# Patient Record
Sex: Female | Born: 1995 | Hispanic: Yes | Smoking: Never smoker
Health system: Southern US, Community
[De-identification: ages and names within clinical notes are randomized; demographics above are authoritative.]

## PROBLEM LIST (undated history)

## (undated) ENCOUNTER — Inpatient Hospital Stay: Payer: Self-pay

## (undated) DIAGNOSIS — G4730 Sleep apnea, unspecified: Secondary | ICD-10-CM

## (undated) DIAGNOSIS — K219 Gastro-esophageal reflux disease without esophagitis: Secondary | ICD-10-CM

## (undated) DIAGNOSIS — K828 Other specified diseases of gallbladder: Secondary | ICD-10-CM

## (undated) DIAGNOSIS — E079 Disorder of thyroid, unspecified: Secondary | ICD-10-CM

## (undated) DIAGNOSIS — F419 Anxiety disorder, unspecified: Secondary | ICD-10-CM

## (undated) DIAGNOSIS — D649 Anemia, unspecified: Secondary | ICD-10-CM

## (undated) DIAGNOSIS — F32A Depression, unspecified: Secondary | ICD-10-CM

## (undated) HISTORY — PX: NO PAST SURGERIES: SHX2092

---
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
## 2016-10-02 ENCOUNTER — Emergency Department: Payer: Self-pay

## 2016-10-02 ENCOUNTER — Emergency Department
Admission: EM | Admit: 2016-10-02 | Discharge: 2016-10-02 | Disposition: A | Discharge status: Home / Self Care | Payer: Self-pay | Attending: Emergency Medicine | Admitting: Emergency Medicine

## 2016-10-02 DIAGNOSIS — S9031XA Contusion of right foot, initial encounter: Secondary | ICD-10-CM | POA: Insufficient documentation

## 2016-10-02 DIAGNOSIS — W208XXA Other cause of strike by thrown, projected or falling object, initial encounter: Secondary | ICD-10-CM | POA: Insufficient documentation

## 2016-10-02 DIAGNOSIS — Y9389 Activity, other specified: Secondary | ICD-10-CM | POA: Insufficient documentation

## 2016-10-02 DIAGNOSIS — Y990 Civilian activity done for income or pay: Secondary | ICD-10-CM | POA: Insufficient documentation

## 2016-10-02 DIAGNOSIS — Y929 Unspecified place or not applicable: Secondary | ICD-10-CM | POA: Insufficient documentation

## 2016-10-02 MED ORDER — MELOXICAM 7.5 MG PO TABS: 7.5000 mg | ORAL_TABLET | Freq: Every day | ORAL | 1 refills | Status: DC

## 2016-10-02 NOTE — ED Provider Notes (Signed)
[facility] Emergency Department Provider Note  ____________________________________________  Time seen: Approximately 6:40 PM  I have reviewed the triage vital signs and the nursing notes.   HISTORY  Chief Complaint Foot Pain    HPI Tamara Harrison is a 21 y.o. female presenting to the emergency department with 4 out of 10 aching right foot pain. Patient states that a piece of heavy metal was dropped on her foot at work. No falls were associated with foot contusion. Patient works in [employer]. Patient states that she has been able to ambulate without difficulty. She denies prior traumas or surgeries to the right lower extremity. Patient states that she has tried ibuprofen, which has minimally relieved her symptoms.   No past medical history on file.  There are no active problems to display for this patient.   History reviewed. No pertinent surgical history.  Prior to Admission medications   Medication Sig Start Date End Date Taking? Authorizing Provider  meloxicam (MOBIC) 7.5 MG tablet Take 1 tablet (7.5 mg total) by mouth daily. 10/02/16 10/02/17  Lannie Fields, PA-C    Allergies Patient has no known allergies.  No family history on file.  Social History Social History  Substance Use Topics  . Smoking status: Never Smoker  . Smokeless tobacco: Never Used  . Alcohol use No     Review of Systems  Constitutional: No fever/chillsts. Cardiovascular: no chest pain. Respiratory: no cough. No SOB. Musculoskeletal: Patient has right foot pain.  Skin: Patient has mild bruising of skin overlying first digit of right lower extremity.  Neurological: Negative for headaches, focal weakness or numbness. 10-point ROS otherwise negative.  ____________________________________________   PHYSICAL EXAM:  VITAL SIGNS: ED Triage Vitals  Enc Vitals Group     BP 10/02/16 1625 (!) 168/78     Pulse Rate 10/02/16 1625 64     Resp 10/02/16 1625 18     Temp  10/02/16 1625 97.6 F (36.4 C)     Temp Source 10/02/16 1625 Oral     SpO2 10/02/16 1625 100 %     Weight 10/02/16 1626 230 lb (104.3 kg)     Height 10/02/16 1626 5\' 5"  (1.651 m)     Head Circumference --      Peak Flow --      Pain Score 10/02/16 1626 5     Pain Loc --      Pain Edu? --      Excl. in [place]? --     Constitutional: Alert and oriented. Well appearing and in no acute distress. Cardiovascular: Normal rate, regular rhythm. Normal S1 and S2. Good peripheral circulation. Respiratory: Normal respiratory effort without tachypnea or retractions. Lungs CTAB. Good air entry to the bases with no decreased or absent breath sounds. Musculoskeletal: Patient has 5 out of 5 strength in the lower extremities bilaterally. Patient is able to perform full dorsiflexion, plantarflexion, inversion and eversion at the right ankle. Patient is able to move all 5 toes. Patient is able to perform resisted extension at the first digit of the right lower extremity. Patient has no significant tenderness elicited with palpation of the metatarsals or digits, right. Palpable dorsalis pedis pulse, right Neurologic:  Normal speech and language. No gross focal neurologic deficits are appreciated. Reflexes are 2+ and symmetric in the lower extremities bilaterally. Skin: Patient has mild bruising of the skin overlying the first digit of the right lower extremity. Psychiatric: Mood and affect are normal. Speech and behavior are normal. Patient exhibits  appropriate insight and judgement. ____________________________________________   LABS (all labs ordered are listed, but only abnormal results are displayed)  Labs Reviewed - No data to display ____________________________________________  EKG   ____________________________________________  RADIOLOGY Unk Pinto, personally viewed and evaluated these images (plain radiographs) as part of my medical decision making, as well as reviewing the written report  by the radiologist.  Dg Foot Complete Right  Result Date: 10/02/2016 CLINICAL DATA:  Status post trauma to the foot on September 27, 2016 with bruising of the first toe. EXAM: RIGHT FOOT COMPLETE - 3+ VIEW COMPARISON:  None. FINDINGS: There is no evidence of fracture or dislocation. There is no evidence of arthropathy or other focal bone abnormality. Soft tissues are unremarkable. IMPRESSION: Negative. Electronically Signed   By: Abelardo Diesel M.D.   On: 10/02/2016 18:28    ____________________________________________    PROCEDURES  Procedure(s) performed:    Procedures    Medications - No data to display   ____________________________________________   INITIAL IMPRESSION / ASSESSMENT AND PLAN / ED COURSE  Pertinent labs & imaging results that were available during my care of the patient were reviewed by me and considered in my medical decision making (see chart for details).  Review of the [place] CSRS was performed in accordance of the [place] prior to dispensing any controlled drugs.  Clinical Course    Assessment and plan: Right Foot Contusion  Patient presents with right foot pain after a piece of metal fell on her foot at work. DG right foot reveals no fractures, dislocations or bony abnormalities.On physical exam, patient is able to perform full range of motion at the right ankle. She has no significant tenderness elicited with palpation of the metatarsals or digits, right. Foot contusion is likely. Patient was discharged with Mobic. A referral was made to orthopedics, Dr. Page Spiro. Patient was advised to make an appointment in one week if right foot pain persists. All patient questions were answered.    ____________________________________________  FINAL CLINICAL IMPRESSION(S) / ED DIAGNOSES  Final diagnoses:  Contusion of right foot, initial encounter      NEW MEDICATIONS STARTED DURING THIS VISIT:  Discharge Medication List as of 10/02/2016  6:45 PM    START taking  these medications   Details  meloxicam (MOBIC) 7.5 MG tablet Take 1 tablet (7.5 mg total) by mouth daily., Starting Mon 10/02/2016, Until Tue 10/02/2017, Print            This chart was dictated using voice recognition software/Dragon. Despite best efforts to proofread, errors can occur which can change the meaning. Any change was purely unintentional.    Lannie Fields, PA-C 10/03/16 1126    Schuyler Amor, MD 10/05/16 2233

## 2016-10-02 NOTE — ED Triage Notes (Signed)
Pt c/o right foot pain and swelling when standing on it X 1 week, no known injury. Pain relieved with rest. Pt alert and oriented X4, active, cooperative, pt in NAD. RR even and unlabored, color WNL.

## 2016-10-02 NOTE — L&D Delivery Note (Addendum)
Delivery Note At 3:24 AM a viable and healthy baby girl "Brisa" was delivered via Vaginal, Spontaneous Delivery (Presentation: LOA ;  ).  APGAR:7 ,9 ; weight 3440g  .   Placenta status: expressed, .  Cord: 3vc with the following complications: true knot, nuchal x1.  Anesthesia:  epidural Episiotomy:  none Lacerations: Periurethral, bilateral Suture Repair: 2.0 vicryl Est. Blood Loss (mL):  1500  Mom to postpartum.  Baby to Couplet care / Skin to Skin.   complications:  - intrapartum hemorrhage - true knot in cord - nuchal cord x1 - BMI 44 - intrapartum stroke signs with neuro consult: CT of head neg, likely Bells palsy - hypothyroid - elevated BP without dx of gHTN - UDS positive for cocaine during pregnancy, negative on hospital admission - trichomoniasis in pregnancy  21yo G1P0 at 39+2wks, iol for obesity - prenatal care at [facility]. During induction, right sided facial drooping noticed. Stat neuro consult, and non-contrast head CT neg; dx of likely Bell's palsy made.   Pitocin given. Pt received epidural and pushed over intact perineum. Baby delivered easily, with a loose nuchal reduced on perineum and baby passed to maternal abdomen. Initial stimulation required for vigorous crying, and baby was taken to the warmer. Prior to delivery of the placenta, brisk bright red bleeding noted from right labial tear and from uterus. The placenta was expressed and manual evacuation of small retained membrane, along with 223mcg of hemabate given IM, slowed bleeding.   Sutures placed interrupted on right labia. Fundus firm, bleeding minimal at close of case. Baby on maternal skin to skin, vigorous and crying. Cord gases collected but not sent.  Sponge and sharp count correct. Pt tolerated procedure well.  Quant blood loss 1515cc  Benjaman Kindler 07/03/2017, 3:50 AM

## 2017-01-18 LAB — OB RESULTS CONSOLE RPR: RPR: NONREACTIVE

## 2017-01-18 LAB — OB RESULTS CONSOLE HIV ANTIBODY (ROUTINE TESTING): HIV: NONREACTIVE

## 2017-01-18 LAB — OB RESULTS CONSOLE GC/CHLAMYDIA
Chlamydia: NEGATIVE
Gonorrhea: NEGATIVE

## 2017-01-18 LAB — OB RESULTS CONSOLE VARICELLA ZOSTER ANTIBODY, IGG: Varicella: NON-IMMUNE/NOT IMMUNE

## 2017-01-18 LAB — OB RESULTS CONSOLE HEPATITIS B SURFACE ANTIGEN: Hepatitis B Surface Ag: NEGATIVE

## 2017-01-18 LAB — OB RESULTS CONSOLE RUBELLA ANTIBODY, IGM: RUBELLA: IMMUNE

## 2017-03-21 ENCOUNTER — Encounter: Payer: Self-pay | Admitting: Emergency Medicine

## 2017-03-21 ENCOUNTER — Emergency Department
Admission: EM | Admit: 2017-03-21 | Discharge: 2017-03-21 | Disposition: A | Discharge status: Home / Self Care | Payer: Medicaid Other | Attending: Emergency Medicine | Admitting: Emergency Medicine

## 2017-03-21 DIAGNOSIS — Z3A21 21 weeks gestation of pregnancy: Secondary | ICD-10-CM | POA: Insufficient documentation

## 2017-03-21 DIAGNOSIS — O99712 Diseases of the skin and subcutaneous tissue complicating pregnancy, second trimester: Secondary | ICD-10-CM | POA: Diagnosis not present

## 2017-03-21 DIAGNOSIS — Z79899 Other long term (current) drug therapy: Secondary | ICD-10-CM | POA: Diagnosis not present

## 2017-03-21 DIAGNOSIS — L209 Atopic dermatitis, unspecified: Secondary | ICD-10-CM | POA: Insufficient documentation

## 2017-03-21 MED ORDER — HYDROCORTISONE 1 % EX CREA
TOPICAL_CREAM | Freq: Two times a day (BID) | CUTANEOUS | Status: DC
  Administered 2017-03-21: 06:00:00 via TOPICAL
  Filled 2017-03-21: qty 28

## 2017-03-21 MED ORDER — DIPHENHYDRAMINE HCL 25 MG PO CAPS
50.0000 mg | ORAL_CAPSULE | Freq: Once | ORAL | Status: AC
  Administered 2017-03-21: 50 mg via ORAL
  Filled 2017-03-21: qty 2

## 2017-03-21 NOTE — ED Provider Notes (Signed)
[facility] Emergency Department Provider Note    First MD Initiated Contact with Patient 03/21/17 [phone]     (approximate)  I have reviewed the triage vital signs and the nursing notes.   HISTORY  Chief Complaint Rash    HPI Tamara Harrison is a 21 y.o. female 5 months pregnant presents to the emergency department with generalized pruritic rash bilateral arms chest and back. Patient denies any fever no nausea vomiting no headache.   Past medical history Currently 5 months pregnant There are no active problems to display for this patient.   Past surgical history None  Prior to Admission medications   Medication Sig Start Date End Date Taking? Authorizing Provider  meloxicam (MOBIC) 7.5 MG tablet Take 1 tablet (7.5 mg total) by mouth daily. 10/02/16 10/02/17  Lannie Fields, PA-C    Allergies No known drug allergies  No family history on file.  Social History Social History  Substance Use Topics  . Smoking status: Never Smoker  . Smokeless tobacco: Never Used  . Alcohol use No    Review of Systems Constitutional: No fever/chills Eyes: No visual changes. ENT: No sore throat. Cardiovascular: Denies chest pain. Respiratory: Denies shortness of breath. Gastrointestinal: No abdominal pain.  No nausea, no vomiting.  No diarrhea.  No constipation. Genitourinary: Negative for dysuria. Musculoskeletal: Negative for neck pain.  Negative for back pain. Integumentary: Positive for rash. Neurological: Negative for headaches, focal weakness or numbness.   ____________________________________________   PHYSICAL EXAM:  VITAL SIGNS: ED Triage Vitals  Enc Vitals Group     BP 03/21/17 0121 (!) 153/65     Pulse Rate 03/21/17 0121 89     Resp 03/21/17 0121 20     Temp 03/21/17 0121 98.4 F (36.9 C)     Temp Source 03/21/17 0121 Oral     SpO2 03/21/17 0121 99 %     Weight 03/21/17 0120 118.8 kg (262 lb)     Height 03/21/17 0120 1.676 m (5\' 6" )       Head Circumference --      Peak Flow --      Pain Score --      Pain Loc --      Pain Edu? --      Excl. in [place]? --     Constitutional: Alert and oriented. Well appearing and in no acute distress. Eyes: Conjunctivae are normal. Head: Atraumatic. Mouth/Throat: Mucous membranes are moist. Neck: No stridor.   Cardiovascular: Normal rate, regular rhythm. Good peripheral circulation. Grossly normal heart sounds. Respiratory: Normal respiratory effort.  No retractions. Lungs CTAB. Gastrointestinal: Soft and nontender. No distention.  Musculoskeletal: No lower extremity tenderness nor edema. No gross deformities of extremities. Neurologic:  Normal speech and language. No gross focal neurologic deficits are appreciated.  Skin:  Petechial rash noted bilateral upper extremity including the antecubital fossa chest and upper back with evidence of excoriation. Psychiatric: Mood and affect are normal. Speech and behavior are normal.   Procedures   ____________________________________________   INITIAL IMPRESSION / ASSESSMENT AND PLAN / ED COURSE  Pertinent labs & imaging results that were available during my care of the patient were reviewed by me and considered in my medical decision making (see chart for details).  Patient given topical hydrocortisone and Benadryl in the emergency department with suspicion of atopic dermatitis pregnancy.      ____________________________________________  FINAL CLINICAL IMPRESSION(S) / ED DIAGNOSES  Final diagnoses:  Atopic dermatitis, unspecified type  MEDICATIONS GIVEN DURING THIS VISIT:  Medications  hydrocortisone cream 1 % (not administered)  diphenhydrAMINE (BENADRYL) capsule 50 mg (not administered)     NEW OUTPATIENT MEDICATIONS STARTED DURING THIS VISIT:  New Prescriptions   No medications on file    Modified Medications   No medications on file    Discontinued Medications   No medications on file     Note:   This document was prepared using Dragon voice recognition software and may include unintentional dictation errors.    Gregor Hams, MD 03/22/17 [phone]

## 2017-03-21 NOTE — ED Triage Notes (Addendum)
Patient ambulatory to triage with steady gait, without difficulty or distress noted; pt reports rash & itching to arms since last week; 44months pregnant

## 2017-04-05 ENCOUNTER — Ambulatory Visit: Admission: RE | Admit: 2017-04-05 | Payer: Medicaid Other | Source: Ambulatory Visit

## 2017-05-07 ENCOUNTER — Encounter: Payer: Self-pay | Admitting: *Deleted

## 2017-05-07 ENCOUNTER — Ambulatory Visit
Admission: RE | Admit: 2017-05-07 | Discharge: 2017-05-07 | Disposition: A | Discharge status: Home / Self Care | Payer: Medicaid Other | Source: Ambulatory Visit | Attending: Maternal & Fetal Medicine | Admitting: Maternal & Fetal Medicine

## 2017-05-07 VITALS — BP 122/73 | HR 83 | Temp 98.2°F | Resp 18 | Ht 66.0 in | Wt 262.4 lb

## 2017-05-07 DIAGNOSIS — Z833 Family history of diabetes mellitus: Secondary | ICD-10-CM | POA: Insufficient documentation

## 2017-05-07 DIAGNOSIS — E669 Obesity, unspecified: Secondary | ICD-10-CM | POA: Insufficient documentation

## 2017-05-07 DIAGNOSIS — O99213 Obesity complicating pregnancy, third trimester: Secondary | ICD-10-CM

## 2017-05-07 DIAGNOSIS — O1210 Gestational proteinuria, unspecified trimester: Secondary | ICD-10-CM | POA: Insufficient documentation

## 2017-05-07 DIAGNOSIS — O99283 Endocrine, nutritional and metabolic diseases complicating pregnancy, third trimester: Secondary | ICD-10-CM | POA: Diagnosis not present

## 2017-05-07 DIAGNOSIS — O3413 Maternal care for benign tumor of corpus uteri, third trimester: Secondary | ICD-10-CM | POA: Diagnosis not present

## 2017-05-07 DIAGNOSIS — E039 Hypothyroidism, unspecified: Secondary | ICD-10-CM | POA: Insufficient documentation

## 2017-05-07 DIAGNOSIS — O99210 Obesity complicating pregnancy, unspecified trimester: Secondary | ICD-10-CM | POA: Insufficient documentation

## 2017-05-07 DIAGNOSIS — Z3A31 31 weeks gestation of pregnancy: Secondary | ICD-10-CM | POA: Insufficient documentation

## 2017-05-07 DIAGNOSIS — O1213 Gestational proteinuria, third trimester: Secondary | ICD-10-CM | POA: Insufficient documentation

## 2017-05-07 DIAGNOSIS — D259 Leiomyoma of uterus, unspecified: Secondary | ICD-10-CM | POA: Insufficient documentation

## 2017-05-07 DIAGNOSIS — Z7982 Long term (current) use of aspirin: Secondary | ICD-10-CM | POA: Insufficient documentation

## 2017-05-07 DIAGNOSIS — O99280 Endocrine, nutritional and metabolic diseases complicating pregnancy, unspecified trimester: Secondary | ICD-10-CM

## 2017-05-07 DIAGNOSIS — O3410 Maternal care for benign tumor of corpus uteri, unspecified trimester: Secondary | ICD-10-CM

## 2017-05-07 LAB — TSH: TSH: 5.213 u[IU]/mL — ABNORMAL HIGH (ref 0.350–4.500)

## 2017-05-07 LAB — CBC
HCT: 35.5 % (ref 35.0–47.0)
Hemoglobin: 12.3 g/dL (ref 12.0–16.0)
MCH: 28.4 pg (ref 26.0–34.0)
MCHC: 34.7 g/dL (ref 32.0–36.0)
MCV: 81.8 fL (ref 80.0–100.0)
PLATELETS: 349 10*3/uL (ref 150–440)
RBC: 4.34 MIL/uL (ref 3.80–5.20)
RDW: 14.4 % (ref 11.5–14.5)
WBC: 11.3 10*3/uL — ABNORMAL HIGH (ref 3.6–11.0)

## 2017-05-07 LAB — COMPREHENSIVE METABOLIC PANEL WITH GFR
ALT: 34 U/L (ref 14–54)
AST: 32 U/L (ref 15–41)
Albumin: 3.1 g/dL — ABNORMAL LOW (ref 3.5–5.0)
Alkaline Phosphatase: 118 U/L (ref 38–126)
Anion gap: 9 (ref 5–15)
BUN: 6 mg/dL (ref 6–20)
CO2: 21 mmol/L — ABNORMAL LOW (ref 22–32)
Calcium: 9.2 mg/dL (ref 8.9–10.3)
Chloride: 106 mmol/L (ref 101–111)
Creatinine, Ser: 0.43 mg/dL — ABNORMAL LOW (ref 0.44–1.00)
GFR calc Af Amer: >60 mL/min
GFR calc non Af Amer: >60 mL/min
Glucose, Bld: 93 mg/dL (ref 65–99)
Potassium: 3.6 mmol/L (ref 3.5–5.1)
Sodium: 136 mmol/L (ref 135–145)
Total Bilirubin: 0.4 mg/dL (ref 0.3–1.2)
Total Protein: 7.2 g/dL (ref 6.5–8.1)

## 2017-05-07 LAB — T4, FREE: Free T4: 0.56 ng/dL — ABNORMAL LOW (ref 0.61–1.12)

## 2017-05-07 MED ORDER — LEVOTHYROXINE SODIUM 50 MCG PO TABS: 50.0000 ug | ORAL_TABLET | Freq: Every day | ORAL | 11 refills | Status: DC

## 2017-05-07 NOTE — Progress Notes (Signed)
[facility] Consultation   Chief Complaint: Proteinuria with elevated P/C ratio, elevated TSH, obesity with BMI of 37.5, positive UDS for cocaine on 01/18/2017.  Her pregnancy is also complicated by fibroids.   HPI: [name] is a 21 y.o. G1P0 at [redacted]w[redacted]d by [redacted]w[redacted]d US performed 01/22/2017 at [facility] who presents in consultation from [facility]  for proteinuria with elevated P/C ratio, elevated TSH, obesity with BMI of 37.5, positive UDS for cocaine on 01/18/2017.  Today, the patient denies any complaints.  She states her last cocaine was in April.     Gynecologic History:   States she had Pap at [facility] which was normal.    Past Medical History: Patient  has no past medical history on file.   Past Surgical History: She  has no past surgical history on file.   Medications:  PN vitamins ASA 81 mg per day  Allergies: Patient has No Known Allergies.   Social History: Patient  reports that she has never smoked. She has never used smokeless tobacco. She reports that she does not drink alcohol or use drugs at the present time.  The patient is currently a homemaker.  Her husband works Architect and is originally from [place].  He has other children in [place] who are healthy.    Family History: family history includes Diabetes in her father.   Review of Systems A full 12 point review of systems was negative or as noted in the History of Present Illness.  Physical Exam: BP 122/73   Pulse 83   Temp 98.2 F (36.8 C)   Resp 18   Ht 5\' 6"  (1.676 m)   Wt 262 lb 6.4 oz (119 kg)   LMP 09/18/2016 (Approximate)   SpO2 98%   BMI 42.35 kg/m   Here for advice only  01/17/2017 UDS positive for cocaine  03/16/2017: TSH 5.540 (upper limit of normal 4.5) free T4 0.99 (0.82 - 1.77) HbA1C 5.1% P/C ratios 486  04/25/2017  P/C ratio 269 UDS negative  02/21/2017 - subop anatomy US at [facility], posterior placenta, no previa  03/22/2017 - FU anatomy US at [facility], 4 cm anterior, intramural  fibroid noted  7/19//2018 - growth [place] at [facility], 2 cm anterior fibroid, 5 cm right lateral fibroid  Asessement: 21 yo gravida 1 at [redacted]w[redacted]d gestation with: 1. Obesity with BMI > 40 2. Elevated TSH with low  free T4 - no clinical symptoms of hypothyroidism, but has laboratory evidence 3. Proteinuria 4. Uterine fibroids 5. History of positive urine drug screen with no further use and corroborating [place] on 04/25/2017  Recommendations: 1. Obesity with BMI > 40 -- Next [place] for growth is scheduled at [facility] on August 13 at 11 AM -- Monthly [place] for growth 2. Elevated TSH with low free T4 - no clinical symptoms of hypothyroidism, but has laboratory evidence -- Given printed prescription for Synthroid 50 mcg to take qd - she plans to fill it at the [facility] -- She should be encouraged to take it throughout the remainder of pregnancy -- She should have a repeat TSH, free T4 in 4 weeks -- Her dose of Synthroid should be titrated to achieve a TSH between 1 and 2.5 and a free T4 at the upper limit of normal. -- Monthly USs for growth and weekly testing with delivery at [redacted] weeks gestation.   -- Follow-up after delivery with her PCP or endocrinologist to manage her hypothyroidism.   -- We would be happy to see  her again.  3. Proteinuria -- urine culture today -- 24 hour urine after she starts care at [facility] 4. Uterine fibroids -- Monthly USs for growth 5. History of positive urine drug screen with no further use and corroborating [place] on 04/25/2017   Total time spent with the patient was 40 minutes with greater than 50% spent in counseling and coordination of care.  We appreciate this  consult and will be happy to be involved in the ongoing care of Ms. Rackley .  Erasmo Score, MD [facility]  Addendum: Free 0.56 (below normal); TSH 5/213 (above normal) consistent with hypothyroidism CMP - normal CBC - normal T3 - pending Urine C & S pending

## 2017-05-08 LAB — URINE CULTURE

## 2017-05-08 LAB — T3, FREE: T3 FREE: 3.6 pg/mL (ref 2.0–4.4)

## 2017-05-21 ENCOUNTER — Inpatient Hospital Stay
Admission: EM | Admit: 2017-05-21 | Discharge: 2017-05-21 | Disposition: A | Discharge status: Home / Self Care | Payer: Medicaid Other | Attending: Obstetrics and Gynecology | Admitting: Obstetrics and Gynecology

## 2017-05-21 DIAGNOSIS — Z3A33 33 weeks gestation of pregnancy: Secondary | ICD-10-CM | POA: Diagnosis not present

## 2017-05-21 DIAGNOSIS — O99213 Obesity complicating pregnancy, third trimester: Secondary | ICD-10-CM | POA: Diagnosis not present

## 2017-05-21 DIAGNOSIS — O99283 Endocrine, nutritional and metabolic diseases complicating pregnancy, third trimester: Secondary | ICD-10-CM | POA: Diagnosis not present

## 2017-05-21 DIAGNOSIS — O1213 Gestational proteinuria, third trimester: Secondary | ICD-10-CM

## 2017-05-21 DIAGNOSIS — E669 Obesity, unspecified: Secondary | ICD-10-CM

## 2017-05-21 DIAGNOSIS — O3413 Maternal care for benign tumor of corpus uteri, third trimester: Secondary | ICD-10-CM

## 2017-05-21 DIAGNOSIS — E039 Hypothyroidism, unspecified: Secondary | ICD-10-CM | POA: Diagnosis not present

## 2017-05-21 DIAGNOSIS — D259 Leiomyoma of uterus, unspecified: Secondary | ICD-10-CM

## 2017-05-21 DIAGNOSIS — O99280 Endocrine, nutritional and metabolic diseases complicating pregnancy, unspecified trimester: Secondary | ICD-10-CM

## 2017-05-21 NOTE — Discharge Summary (Signed)
Obstetric Discharge Summary Reason for Admission: NST Prenatal Procedures: [place] Intrapartum Procedures: N/A Postpartum Procedures: N/A Complications-Operative and Postpartum:  Hemoglobin  Date Value Ref Range Status  05/07/2017 12.3 12.0 - 16.0 g/dL Final   HGB  Date Value Ref Range Status  04/18/2012 13.9 12.0 - 16.0 g/dL Final   HCT  Date Value Ref Range Status  05/07/2017 35.5 35.0 - 47.0 % Final  04/18/2012 42.1 35.0 - 47.0 % Final    Physical Exam:  General:A,A&O x 3 Resp reg and non-labored Uterine Fundus:Gravid Incision:N/A NST reactive with 2 accels 15 x 15 BPM  Discharge Diagnoses: IUP at 33 4/7 weeks with Obesity  Discharge Information: Date: 05/21/2017 Activity:Up ad lib Diet: Regular  Medications: PNV Condition: Stable  Instructions: FKC's daily  Discharge to: Home   Newborn Data: None yet  Tamara Harrison 05/21/2017, 2:18 PM

## 2017-05-21 NOTE — Progress Notes (Signed)
Patient ID: Tamara Harrison, female   DOB: November 03, 1995, 21 y.o.   MRN: [id number]  Pt is here for NST today from [facility] due to Morbid Obesity. Pt has EDD of 07/05/17 by [place] and EDD by LMP was 07/08/17 and has been seen by [facility] for [place]. Pt also has recent diagnosis of hypothyroidism. NST reactive with 2 accels 15 x 15 BPM. DC home with NST as scheduled by [facility].

## 2017-05-21 NOTE — OB Triage Note (Signed)
Ms. Henkin here for scheduled NST

## 2017-06-13 LAB — OB RESULTS CONSOLE GBS: GBS: POSITIVE

## 2017-06-26 ENCOUNTER — Observation Stay
Admission: EM | Admit: 2017-06-26 | Discharge: 2017-06-26 | Disposition: A | Discharge status: Home / Self Care | Payer: Medicaid Other | Attending: Obstetrics & Gynecology | Admitting: Obstetrics & Gynecology

## 2017-06-26 DIAGNOSIS — Z833 Family history of diabetes mellitus: Secondary | ICD-10-CM | POA: Insufficient documentation

## 2017-06-26 DIAGNOSIS — O99280 Endocrine, nutritional and metabolic diseases complicating pregnancy, unspecified trimester: Secondary | ICD-10-CM

## 2017-06-26 DIAGNOSIS — O99213 Obesity complicating pregnancy, third trimester: Secondary | ICD-10-CM

## 2017-06-26 DIAGNOSIS — D259 Leiomyoma of uterus, unspecified: Secondary | ICD-10-CM

## 2017-06-26 DIAGNOSIS — Z7982 Long term (current) use of aspirin: Secondary | ICD-10-CM | POA: Insufficient documentation

## 2017-06-26 DIAGNOSIS — Z79899 Other long term (current) drug therapy: Secondary | ICD-10-CM | POA: Insufficient documentation

## 2017-06-26 DIAGNOSIS — O0993 Supervision of high risk pregnancy, unspecified, third trimester: Secondary | ICD-10-CM | POA: Diagnosis not present

## 2017-06-26 DIAGNOSIS — O1213 Gestational proteinuria, third trimester: Secondary | ICD-10-CM

## 2017-06-26 DIAGNOSIS — Z3A38 38 weeks gestation of pregnancy: Secondary | ICD-10-CM | POA: Insufficient documentation

## 2017-06-26 DIAGNOSIS — E039 Hypothyroidism, unspecified: Secondary | ICD-10-CM

## 2017-06-26 DIAGNOSIS — O3413 Maternal care for benign tumor of corpus uteri, third trimester: Secondary | ICD-10-CM

## 2017-06-26 NOTE — OB Triage Note (Signed)
[name] here for scheduled NST, pt reports positive fetal movement, denies any problems at this time.

## 2017-06-26 NOTE — Discharge Instructions (Signed)

## 2017-06-26 NOTE — Discharge Summary (Signed)
Tamara Harrison is a 21 y.o. female. She is at [redacted]w[redacted]d gestation. Patient's last menstrual period was 09/18/2016 (approximate). Estimated Date of Delivery: 07/08/17   Prenatal care site: [facility]  Chief Complaint: high risk pregnancy, need for antepartum surveillance  S: Resting comfortably. no CTX, no VB.no LOF,  Active fetal movement.    Maternal Medical History:  History reviewed. No pertinent past medical history.  No past surgical history on file.  No Known Allergies  Prior to Admission medications   Medication Sig Start Date End Date Taking? Authorizing Provider  aspirin EC 81 MG tablet Take 81 mg by mouth daily.    [provider]  levothyroxine (SYNTHROID) 50 MCG tablet Take 1 tablet (50 mcg total) by mouth daily. 05/07/17 05/07/18  Dellia Nims, MD  Prenatal Vit-Fe Fumarate-FA (PRENATAL MULTIVITAMIN) TABS tablet Take 1 tablet by mouth daily at 12 noon.    [provider]     Social History: She  reports that she has never smoked. She has never used smokeless tobacco. She reports that she does not drink alcohol or use drugs.  Family History: family history includes Diabetes in her father.   Review of Systems: A full review of systems was performed and negative except as noted in the HPI.     O:  BP 121/70   Pulse 89   Temp 98.1 F (36.7 C) (Oral)   Resp 16   LMP 09/18/2016 (Approximate)  No results found for this or any previous visit (from the past 48 hour(s)).   Constitutional: NAD, AAOx3  HE/ENT: extraocular movements grossly intact, moist mucous membranes CV: RRR PULM: nl respiratory effort, CTABL     Abd: gravid, non-tender, non-distended, soft      Ext: Non-tender, Nonedmeatous   Psych: mood appropriate, speech normal Pelvic: deferred  Baseline: 155 Variability: moderate Accelerations present x >2 Decelerations absent Time 84mins    A/P: 21 y.o. [redacted]w[redacted]d with high risk pregnancy and antepartum surveillance.   Labor: not present.    Fetal Wellbeing: Reassuring Cat 1 tracing.  Reactive NST   D/c home stable, precautions reviewed, follow-up as scheduled.   ----- Larey Days, MD Attending Obstetrician and Gynecologist [facility], Department of [facility]

## 2017-07-01 ENCOUNTER — Other Ambulatory Visit: Payer: Self-pay | Admitting: Obstetrics & Gynecology

## 2017-07-02 ENCOUNTER — Encounter: Payer: Self-pay | Admitting: *Deleted

## 2017-07-02 ENCOUNTER — Inpatient Hospital Stay
Admission: EM | Admit: 2017-07-02 | Discharge: 2017-07-04 | DRG: 806 | Disposition: A | Discharge status: Home / Self Care | Payer: Medicaid Other | Attending: Obstetrics and Gynecology | Admitting: Obstetrics and Gynecology

## 2017-07-02 ENCOUNTER — Inpatient Hospital Stay: Payer: Medicaid Other

## 2017-07-02 DIAGNOSIS — O99350 Diseases of the nervous system complicating pregnancy, unspecified trimester: Secondary | ICD-10-CM

## 2017-07-02 DIAGNOSIS — G510 Bell's palsy: Secondary | ICD-10-CM | POA: Diagnosis not present

## 2017-07-02 DIAGNOSIS — Z3A39 39 weeks gestation of pregnancy: Secondary | ICD-10-CM | POA: Diagnosis not present

## 2017-07-02 DIAGNOSIS — D259 Leiomyoma of uterus, unspecified: Secondary | ICD-10-CM

## 2017-07-02 DIAGNOSIS — E039 Hypothyroidism, unspecified: Secondary | ICD-10-CM

## 2017-07-02 DIAGNOSIS — O26893 Other specified pregnancy related conditions, third trimester: Secondary | ICD-10-CM | POA: Diagnosis present

## 2017-07-02 DIAGNOSIS — Z6841 Body Mass Index (BMI) 40.0 and over, adult: Secondary | ICD-10-CM | POA: Diagnosis not present

## 2017-07-02 DIAGNOSIS — D649 Anemia, unspecified: Secondary | ICD-10-CM | POA: Diagnosis present

## 2017-07-02 DIAGNOSIS — O9902 Anemia complicating childbirth: Secondary | ICD-10-CM | POA: Diagnosis present

## 2017-07-02 DIAGNOSIS — O99214 Obesity complicating childbirth: Secondary | ICD-10-CM | POA: Diagnosis present

## 2017-07-02 DIAGNOSIS — F1410 Cocaine abuse, uncomplicated: Secondary | ICD-10-CM | POA: Diagnosis present

## 2017-07-02 DIAGNOSIS — R030 Elevated blood-pressure reading, without diagnosis of hypertension: Secondary | ICD-10-CM | POA: Diagnosis present

## 2017-07-02 DIAGNOSIS — O3413 Maternal care for benign tumor of corpus uteri, third trimester: Secondary | ICD-10-CM | POA: Diagnosis present

## 2017-07-02 DIAGNOSIS — O99280 Endocrine, nutritional and metabolic diseases complicating pregnancy, unspecified trimester: Secondary | ICD-10-CM

## 2017-07-02 DIAGNOSIS — O99324 Drug use complicating childbirth: Secondary | ICD-10-CM | POA: Diagnosis present

## 2017-07-02 DIAGNOSIS — O99284 Endocrine, nutritional and metabolic diseases complicating childbirth: Secondary | ICD-10-CM | POA: Diagnosis present

## 2017-07-02 DIAGNOSIS — O99824 Streptococcus B carrier state complicating childbirth: Secondary | ICD-10-CM | POA: Diagnosis present

## 2017-07-02 DIAGNOSIS — O1213 Gestational proteinuria, third trimester: Secondary | ICD-10-CM

## 2017-07-02 HISTORY — DX: Disorder of thyroid, unspecified: E07.9

## 2017-07-02 LAB — URINE DRUG SCREEN, QUALITATIVE (ARMC ONLY)
Amphetamines, Ur Screen: NOT DETECTED
Barbiturates, Ur Screen: NOT DETECTED
Benzodiazepine, Ur Scrn: NOT DETECTED
COCAINE METABOLITE, UR ~~LOC~~: NOT DETECTED
Cannabinoid 50 Ng, Ur ~~LOC~~: NOT DETECTED
MDMA (Ecstasy)Ur Screen: NOT DETECTED
METHADONE SCREEN, URINE: NOT DETECTED
OPIATE, UR SCREEN: NOT DETECTED
Phencyclidine (PCP) Ur S: NOT DETECTED
Tricyclic, Ur Screen: NOT DETECTED

## 2017-07-02 LAB — COMPREHENSIVE METABOLIC PANEL
ALT: 20 U/L (ref 14–54)
AST: 30 U/L (ref 15–41)
Albumin: 2.9 g/dL — ABNORMAL LOW (ref 3.5–5.0)
Alkaline Phosphatase: 159 U/L — ABNORMAL HIGH (ref 38–126)
Anion gap: 10 (ref 5–15)
BUN: 8 mg/dL (ref 6–20)
CHLORIDE: 107 mmol/L (ref 101–111)
CO2: 19 mmol/L — ABNORMAL LOW (ref 22–32)
CREATININE: 0.54 mg/dL (ref 0.44–1.00)
Calcium: 9 mg/dL (ref 8.9–10.3)
GFR calc Af Amer: >60 mL/min (ref >60)
GFR calc non Af Amer: >60 mL/min (ref >60)
Glucose, Bld: 109 mg/dL — ABNORMAL HIGH (ref 65–99)
Potassium: 3.6 mmol/L (ref 3.5–5.1)
Sodium: 136 mmol/L (ref 135–145)
Total Bilirubin: 0.4 mg/dL (ref 0.3–1.2)
Total Protein: 6.9 g/dL (ref 6.5–8.1)

## 2017-07-02 LAB — CBC
HEMATOCRIT: 34.5 % — AB (ref 35.0–47.0)
Hemoglobin: 11.9 g/dL — ABNORMAL LOW (ref 12.0–16.0)
MCH: 27 pg (ref 26.0–34.0)
MCHC: 34.4 g/dL (ref 32.0–36.0)
MCV: 78.5 fL — ABNORMAL LOW (ref 80.0–100.0)
Platelets: 341 10*3/uL (ref 150–440)
RBC: 4.4 MIL/uL (ref 3.80–5.20)
RDW: 14.3 % (ref 11.5–14.5)
WBC: 10 10*3/uL (ref 3.6–11.0)

## 2017-07-02 LAB — PROTEIN / CREATININE RATIO, URINE
Creatinine, Urine: 120 mg/dL
PROTEIN CREATININE RATIO: 0.18 mg/mg{creat} — AB (ref 0.00–0.15)
Total Protein, Urine: 21 mg/dL

## 2017-07-02 LAB — TYPE AND SCREEN
ABO/RH(D): O POS
ANTIBODY SCREEN: NEGATIVE

## 2017-07-02 LAB — TSH: TSH: 7.057 u[IU]/mL — ABNORMAL HIGH (ref 0.350–4.500)

## 2017-07-02 MED ORDER — MISOPROSTOL 200 MCG PO TABS
ORAL_TABLET | ORAL | Status: AC
  Filled 2017-07-02: qty 4

## 2017-07-02 MED ORDER — BUTORPHANOL TARTRATE 1 MG/ML IJ SOLN
1.0000 mg | INTRAMUSCULAR | Status: DC | PRN
  Administered 2017-07-02 (×4): 1 mg via INTRAVENOUS
  Filled 2017-07-02 (×2): qty 1

## 2017-07-02 MED ORDER — ONDANSETRON HCL 4 MG/2ML IJ SOLN: 4.0000 mg | Freq: Four times a day (QID) | INTRAMUSCULAR | Status: DC | PRN

## 2017-07-02 MED ORDER — PENICILLIN G POTASSIUM 5000000 UNITS IJ SOLR
5.0000 10*6.[IU] | Freq: Once | INTRAVENOUS | Status: AC
  Administered 2017-07-02: 5 10*6.[IU] via INTRAVENOUS
  Filled 2017-07-02: qty 5

## 2017-07-02 MED ORDER — OXYTOCIN 40 UNITS IN LACTATED RINGERS INFUSION - SIMPLE MED
2.5000 [IU]/h | INTRAVENOUS | Status: DC
  Administered 2017-07-03: 2.5 [IU]/h via INTRAVENOUS

## 2017-07-02 MED ORDER — LEVOTHYROXINE SODIUM 25 MCG PO TABS
25.0000 ug | ORAL_TABLET | Freq: Every day | ORAL | Status: DC
  Administered 2017-07-02 – 2017-07-04 (×3): 25 ug via ORAL
  Filled 2017-07-02 (×3): qty 1

## 2017-07-02 MED ORDER — PENICILLIN G POT IN DEXTROSE 60000 UNIT/ML IV SOLN
3.0000 10*6.[IU] | INTRAVENOUS | Status: DC
  Administered 2017-07-02 (×4): 3 10*6.[IU] via INTRAVENOUS
  Filled 2017-07-02 (×17): qty 50

## 2017-07-02 MED ORDER — MISOPROSTOL 25 MCG QUARTER TABLET
25.0000 ug | ORAL_TABLET | ORAL | Status: DC | PRN
  Administered 2017-07-02: 25 ug via VAGINAL
  Filled 2017-07-02 (×2): qty 1

## 2017-07-02 MED ORDER — SOD CITRATE-CITRIC ACID 500-334 MG/5ML PO SOLN: 30.0000 mL | ORAL | Status: DC | PRN

## 2017-07-02 MED ORDER — LACTATED RINGERS IV SOLN: 500.0000 mL | INTRAVENOUS | Status: DC | PRN

## 2017-07-02 MED ORDER — OXYTOCIN BOLUS FROM INFUSION
500.0000 mL | Freq: Once | INTRAVENOUS | Status: AC
  Administered 2017-07-03: 500 mL via INTRAVENOUS

## 2017-07-02 MED ORDER — LIDOCAINE HCL (PF) 1 % IJ SOLN
INTRAMUSCULAR | Status: AC
  Filled 2017-07-02: qty 30

## 2017-07-02 MED ORDER — LIDOCAINE HCL (PF) 1 % IJ SOLN: 30.0000 mL | INTRAMUSCULAR | Status: DC | PRN

## 2017-07-02 MED ORDER — AMMONIA AROMATIC IN INHA
RESPIRATORY_TRACT | Status: AC
  Filled 2017-07-02: qty 10

## 2017-07-02 MED ORDER — TERBUTALINE SULFATE 1 MG/ML IJ SOLN: 0.2500 mg | Freq: Once | INTRAMUSCULAR | Status: DC | PRN

## 2017-07-02 MED ORDER — LACTATED RINGERS IV SOLN
INTRAVENOUS | Status: DC
  Administered 2017-07-02: 1000 mL via INTRAVENOUS
  Administered 2017-07-02 – 2017-07-03 (×2): via INTRAVENOUS

## 2017-07-02 MED ORDER — OXYTOCIN 10 UNIT/ML IJ SOLN
INTRAMUSCULAR | Status: AC
  Filled 2017-07-02: qty 2

## 2017-07-02 MED ORDER — ACETAMINOPHEN 500 MG PO TABS: 1000.0000 mg | ORAL_TABLET | Freq: Four times a day (QID) | ORAL | Status: DC | PRN

## 2017-07-02 MED ORDER — OXYTOCIN 40 UNITS IN LACTATED RINGERS INFUSION - SIMPLE MED
1.0000 m[IU]/min | INTRAVENOUS | Status: DC
  Administered 2017-07-02: 2 m[IU]/min via INTRAVENOUS
  Administered 2017-07-02: 4 m[IU]/min via INTRAVENOUS
  Administered 2017-07-02: 2 m[IU]/min via INTRAVENOUS
  Filled 2017-07-02: qty 1000

## 2017-07-02 NOTE — Progress Notes (Signed)
Head CT neg for abnormality. Will resume induction

## 2017-07-02 NOTE — Progress Notes (Signed)
Patient ID: Tamara Harrison, female   DOB: 1995-11-28, 21 y.o.   MRN: [id number]   20yo G1P0 at 39+1wks iol for obesity by outside group, now with facial drooping.  Pitocin at 90mu/min, ctx q3-4 min. FHT- Cat I SCE: 4/80/0 with cervix posterior   S/p neurology consult: plan for head CT without contrast. If normal, will continue labor. If abnormal, will re-consult neurology.  - continue elevated BP parameters of 160/100 at neuro request.

## 2017-07-02 NOTE — Consult Note (Signed)
Reason for Consult:Right facial droop Referring Physician: Ward  CC: Right facial droop  HPI: Tamara Harrison is an 21 y.o.pregnant female currently laboring who reports that early this morning prior to coming to the hospital after eating at about 0230 noted that her right face was drooping.  Had no diplopia, change in taste, no sensory disturbances, no fullness in the ear, no excessive tearing.  Has noted some slurred speech as well.  No extremity complaints.   Reports mother had Bell's palsy when she was pregnant.    No past medical history on file.  No past surgical history on file.  Family History  Problem Relation Age of Onset  . Diabetes Father   Mother with Bell's palsy during pregnancy  Social History:  reports that she has never smoked. She has never used smokeless tobacco. She reports that she does not drink alcohol or use drugs.  No Known Allergies  Medications:  I have reviewed the patient's current medications. Prior to Admission:  Prescriptions Prior to Admission  Medication Sig Dispense Refill Last Dose  . aspirin EC 81 MG tablet Take 81 mg by mouth daily.   07/01/2017 at Unknown time  . levothyroxine (SYNTHROID) 50 MCG tablet Take 1 tablet (50 mcg total) by mouth daily. (Patient taking differently: Take 25 mcg by mouth daily. ) 30 tablet 11 07/01/2017 at Unknown time  . Prenatal Vit-Fe Fumarate-FA (PRENATAL MULTIVITAMIN) TABS tablet Take 1 tablet by mouth daily at 12 noon.   07/01/2017 at Unknown time   Scheduled: . ammonia      . levothyroxine  25 mcg Oral QAC breakfast  . lidocaine (PF)      . misoprostol      . oxytocin      . oxytocin 40 units in LR 1000 mL  500 mL Intravenous Once    ROS: History obtained from the patient  General ROS: negative for - chills, fatigue, fever, night sweats, weight gain or weight loss Psychological ROS: negative for - behavioral disorder, hallucinations, memory difficulties, mood swings or suicidal ideation Ophthalmic ROS:  negative for - blurry vision, double vision, eye pain or loss of vision ENT ROS: negative for - epistaxis, nasal discharge, oral lesions, sore throat, tinnitus or vertigo Allergy and Immunology ROS: negative for - hives or itchy/watery eyes Hematological and Lymphatic ROS: negative for - bleeding problems, bruising or swollen lymph nodes Endocrine ROS: negative for - galactorrhea, hair pattern changes, polydipsia/polyuria or temperature intolerance Respiratory ROS: negative for - cough, hemoptysis, shortness of breath or wheezing Cardiovascular ROS: LE edema Gastrointestinal ROS: negative for - abdominal pain, diarrhea, hematemesis, nausea/vomiting or stool incontinence Genito-Urinary ROS: negative for - dysuria, hematuria, incontinence or urinary frequency/urgency Musculoskeletal ROS: negative for - joint swelling or muscular weakness Neurological ROS: as noted in HPI Dermatological ROS: negative for rash and skin lesion changes  Physical Examination: Blood pressure 139/87, pulse 84, temperature (!) 97.5 F (36.4 C), temperature source Oral, resp. rate 16, height 5\' 6"  (1.676 m), weight 123.4 kg (272 lb), last menstrual period 09/18/2016.  HEENT-  Normocephalic, no lesions, without obvious abnormality.  Normal external eye and conjunctiva.  Normal TM's bilaterally.  Normal auditory canals and external ears. Normal external nose, mucus membranes and septum.  Normal pharynx. Cardiovascular- S1, S2 normal, pulses palpable throughout   Lungs- chest clear, no wheezing, rales, normal symmetric air entry Abdomen- pregnant Extremities- LE edema Lymph-no adenopathy palpable Musculoskeletal-no joint tenderness, deformity or swelling Skin-warm and dry, no hyperpigmentation, vitiligo, or suspicious lesions  Neurological Examination   Mental Status: Alert, oriented, thought content appropriate.  Speech fluent without evidence of aphasia.  Able to follow 3 step commands without difficulty. Cranial  Nerves: II: Discs flat bilaterally; Visual fields grossly normal, pupils equal, round, reactive to light and accommodation III,IV, VI: ptosis not present, extra-ocular motions intact bilaterally V,VII: right facial droop with decreased right eye closure and absent forehead wrinkling on the right, facial light touch sensation normal bilaterally VIII: hearing normal bilaterally IX,X: gag reflex present XI: bilateral shoulder shrug XII: midline tongue extension Motor: Right : Upper extremity   5/5    Left:     Upper extremity   5/5  Lower extremity   5/5     Lower extremity   5/5 Tone and bulk:normal tone throughout; no atrophy noted Sensory: Pinprick and light touch intact throughout, bilaterally Deep Tendon Reflexes: 2+ and symmetric throughout Plantars: Right: downgoing   Left: downgoing Cerebellar: Normal finger-to-nose testing Gait: not tested due to laboring    Laboratory Studies:   Basic Metabolic Panel:  Recent Labs Lab 07/02/17 0530  NA 136  K 3.6  CL 107  CO2 19*  GLUCOSE 109*  BUN 8  CREATININE 0.54  CALCIUM 9.0    Liver Function Tests:  Recent Labs Lab 07/02/17 0530  AST 30  ALT 20  ALKPHOS 159*  BILITOT 0.4  PROT 6.9  ALBUMIN 2.9*   No results for input(s): LIPASE, AMYLASE in the last 168 hours. No results for input(s): AMMONIA in the last 168 hours.  CBC:  Recent Labs Lab 07/02/17 0530  WBC 10.0  HGB 11.9*  HCT 34.5*  MCV 78.5*  PLT 341    Cardiac Enzymes: No results for input(s): CKTOTAL, CKMB, CKMBINDEX, TROPONINI in the last 168 hours.  BNP: Invalid input(s): POCBNP  CBG: No results for input(s): GLUCAP in the last 168 hours.  Microbiology: Results for orders placed or performed during the hospital encounter of 07/02/17  OB RESULT CONSOLE Group B Strep     Status: None   Collection Time: 06/13/17 12:00 AM  Result Value Ref Range Status   GBS Positive  Final    Coagulation Studies: No results for input(s): LABPROT, INR in  the last 72 hours.  Urinalysis: No results for input(s): COLORURINE, LABSPEC, PHURINE, GLUCOSEU, HGBUR, BILIRUBINUR, KETONESUR, PROTEINUR, UROBILINOGEN, NITRITE, LEUKOCYTESUR in the last 168 hours.  Invalid input(s): APPERANCEUR  Lipid Panel:  No results found for: CHOL, TRIG, HDL, CHOLHDL, VLDL, LDLCALC  HgbA1C: No results found for: HGBA1C  Urine Drug Screen:     Component Value Date/Time   LABOPIA NONE DETECTED 07/02/2017 0530   COCAINSCRNUR NONE DETECTED 07/02/2017 0530   LABBENZ NONE DETECTED 07/02/2017 0530   AMPHETMU NONE DETECTED 07/02/2017 0530   THCU NONE DETECTED 07/02/2017 0530   LABBARB NONE DETECTED 07/02/2017 0530    Alcohol Level: No results for input(s): ETH in the last 168 hours.  Imaging: No results found.   Assessment/Plan: 21 year old female currently in labor who reports onset of right facial droop early this morning.  Has no other associated symptoms.  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  Interestingly, mother had Bell's palsy with pregnancy as well.    Recommendations: 1.  Head CT without contrast.  If unremarkable, no further neurological testing recommended at this time.  2.  Agree with continued measures for identification of elevated BP and eclampsia since this can be associated.    Case discussed with Dr. Leonides Schanz  Alexis Goodell, MD Neurology [phone] 07/02/2017, 1:33 PM

## 2017-07-02 NOTE — Progress Notes (Signed)
Dr. Leafy Ro in to discuss plan of care. Pitocin dc'ed  Per MD order. Pt. Taken by wheelchair to CT for evaluation.

## 2017-07-02 NOTE — Progress Notes (Signed)
Notified by nurse that family states they have noted some facial droop and slurred speech since admission.  Either Dr. Leafy Ro or myself will assess patient; stat neurological consult placed.

## 2017-07-02 NOTE — Progress Notes (Signed)
Dr. Leonides Schanz on unit and made aware of elevated BP.  BP retaken and under parameters.

## 2017-07-02 NOTE — H&P (Signed)
OB History & Physical   History of Present Illness:  Chief Complaint:   HPI:  Tamara Harrison is a 21 y.o. G1P0 female at [redacted]w[redacted]d dated by 16 week ultrasound with Estimated Date of Delivery: 07/08/17 Estimated LMP: 10/01/16 She presents to L&D for scheduled IOL for obesity, proteinuria, and high risk pregnancy  +FM, no CTX, no LOF, no VB  Pregnancy Issues: 1. Hypothyroid - last TSH 5.540 2. Proteinuria 3. Fibroids 4. Obesity (37.5) 5. Cocaine abuse during pregnancy 6. Trichomoniasis this pregnancy 7. Poor social situation (does not have access to refrigerator at home, parents are not in her life, brother is, she calls another woman "mom" and her boyfriend/FOB her "husband")   Maternal Medical History:  Obesity Drug use Hypothyroidism  No past surgical history on file.  No Known Allergies  Prior to Admission medications   Medication Sig Start Date End Date Taking? Authorizing Provider  aspirin EC 81 MG tablet Take 81 mg by mouth daily.   Yes [provider]  levothyroxine (SYNTHROID) 50 MCG tablet Take 1 tablet (50 mcg total) by mouth daily. Patient taking differently: Take 25 mcg by mouth daily.  05/07/17 05/07/18 Yes Tamara Nims, MD  Prenatal Vit-Fe Fumarate-FA (PRENATAL MULTIVITAMIN) TABS tablet Take 1 tablet by mouth daily at 12 noon.   Yes [provider]     Prenatal care site: [facility]   Social History: She  reports that she has never smoked. She has never used smokeless tobacco. She reports that she does not drink alcohol or use drugs.  Family History: family history includes Diabetes in her father.   Review of Systems: A full review of systems was performed and negative except as noted in the HPI.     Physical Exam:  Vital Signs: BP 139/87   Pulse 84   Temp (!) 97.5 F (36.4 C) (Oral)   Resp 16   Ht 5\' 6"  (1.676 m)   Wt 123.4 kg (272 lb)   LMP 09/18/2016 (Approximate)   BMI 43.90 kg/m  General: no acute distress.   HEENT: normocephalic, atraumatic Heart: regular rate & rhythm.  No murmurs/rubs/gallops Lungs: clear to auscultation bilaterally, normal respiratory effort Abdomen: soft, gravid, non-tender;  EFW: 9lbs Pelvic:   External: Normal external female genitalia  Cervix: Dilation: 3.5 / Effacement (%): 60 / Station: -2    Extremities: non-tender, symmetric,  edema bilaterally.  DTRs: 2+  Neurologic: Alert & oriented x 3.    Results for orders placed or performed during the hospital encounter of 07/02/17 (from the past 24 hour(s))  CBC     Status: Abnormal   Collection Time: 07/02/17  5:30 AM  Result Value Ref Range   WBC 10.0 3.6 - 11.0 K/uL   RBC 4.40 3.80 - 5.20 MIL/uL   Hemoglobin 11.9 (L) 12.0 - 16.0 g/dL   HCT 34.5 (L) 35.0 - 47.0 %   MCV 78.5 (L) 80.0 - 100.0 fL   MCH 27.0 26.0 - 34.0 pg   MCHC 34.4 32.0 - 36.0 g/dL   RDW 14.3 11.5 - 14.5 %   Platelets 341 150 - 440 K/uL  Type and screen     Status: None   Collection Time: 07/02/17  5:30 AM  Result Value Ref Range   ABO/RH(D) O POS    Antibody Screen NEG    Sample Expiration 07/05/2017   TSH     Status: Abnormal   Collection Time: 07/02/17  5:30 AM  Result Value Ref Range  TSH 7.057 (H) 0.350 - 4.500 uIU/mL  Urine Drug Screen, Qualitative ([facility] only)     Status: None   Collection Time: 07/02/17  5:30 AM  Result Value Ref Range   Tricyclic, Ur Screen NONE DETECTED NONE DETECTED   Amphetamines, Ur Screen NONE DETECTED NONE DETECTED   MDMA (Ecstasy)Ur Screen NONE DETECTED NONE DETECTED   Cocaine Metabolite,Ur [place] NONE DETECTED NONE DETECTED   Opiate, Ur Screen NONE DETECTED NONE DETECTED   Phencyclidine (PCP) Ur S NONE DETECTED NONE DETECTED   Cannabinoid 50 Ng, Ur [place] NONE DETECTED NONE DETECTED   Barbiturates, Ur Screen NONE DETECTED NONE DETECTED   Benzodiazepine, Ur Scrn NONE DETECTED NONE DETECTED   Methadone Scn, Ur NONE DETECTED NONE DETECTED  Protein / creatinine ratio, urine     Status: Abnormal   Collection Time:  07/02/17  5:30 AM  Result Value Ref Range   Creatinine, Urine 120 mg/dL   Total Protein, Urine 21 mg/dL   Protein Creatinine Ratio 0.18 (H) 0.00 - 0.15 mg/mg[Cre]  Comprehensive metabolic panel     Status: Abnormal   Collection Time: 07/02/17  5:30 AM  Result Value Ref Range   Sodium 136 135 - 145 mmol/L   Potassium 3.6 3.5 - 5.1 mmol/L   Chloride 107 101 - 111 mmol/L   CO2 19 (L) 22 - 32 mmol/L   Glucose, Bld 109 (H) 65 - 99 mg/dL   BUN 8 6 - 20 mg/dL   Creatinine, Ser 0.54 0.44 - 1.00 mg/dL   Calcium 9.0 8.9 - 10.3 mg/dL   Total Protein 6.9 6.5 - 8.1 g/dL   Albumin 2.9 (L) 3.5 - 5.0 g/dL   AST 30 15 - 41 U/L   ALT 20 14 - 54 U/L   Alkaline Phosphatase 159 (H) 38 - 126 U/L   Total Bilirubin 0.4 0.3 - 1.2 mg/dL   GFR calc non Af Amer >60 >60 mL/min   GFR calc Af Amer >60 >60 mL/min   Anion gap 10 5 - 15    Pertinent Results:  Prenatal Labs: Blood type/Rh O+  Antibody screen neg  Rubella Immune  Varicella Immune  RPR NR  HBsAg Neg  HIV NR  GC neg  Chlamydia neg  Genetic screening negative  1 hour GTT 111  3 hour GTT   GBS positive   SVE:  Dilation: 3.5 / Effacement (%): 60 / Station: -2    Cephalic by leopolds    Assessment:  Tamara Harrison is a 22 y.o. G1P0 female at [redacted]w[redacted]d with IOL for obesity.   Plan:  1. Admit to Labor & Delivery 2. CBC, T&S, Clrs, IVF 3. GBS positive, pcn ordered 4. Consents obtained. 5. Continuous efm/toco 6. IOL: cytotec vaginally to start then will reassess 7. Elevated blood pressures on admission, already with proteinuria.  Will have to assess for symptoms and for lab abnormalities.  ----- Larey Days, MD Attending Obstetrician and Gynecologist [facility], Department of [facility]

## 2017-07-03 ENCOUNTER — Encounter: Payer: Self-pay | Admitting: Anesthesiology

## 2017-07-03 ENCOUNTER — Inpatient Hospital Stay: Payer: Medicaid Other | Admitting: Anesthesiology

## 2017-07-03 LAB — CHLAMYDIA/NGC RT PCR (ARMC ONLY)
CHLAMYDIA TR: NOT DETECTED
N GONORRHOEAE: NOT DETECTED

## 2017-07-03 LAB — CBC
HCT: 30.8 % — ABNORMAL LOW (ref 35.0–47.0)
Hemoglobin: 10.5 g/dL — ABNORMAL LOW (ref 12.0–16.0)
MCH: 27.2 pg (ref 26.0–34.0)
MCHC: 34.1 g/dL (ref 32.0–36.0)
MCV: 79.7 fL — AB (ref 80.0–100.0)
PLATELETS: 336 10*3/uL (ref 150–440)
RBC: 3.87 MIL/uL (ref 3.80–5.20)
RDW: 14.3 % (ref 11.5–14.5)
WBC: 17.2 10*3/uL — AB (ref 3.6–11.0)

## 2017-07-03 LAB — RPR: RPR: NONREACTIVE

## 2017-07-03 MED ORDER — ONDANSETRON HCL 4 MG/2ML IJ SOLN: 4.0000 mg | INTRAMUSCULAR | Status: DC | PRN

## 2017-07-03 MED ORDER — IBUPROFEN 600 MG PO TABS
600.0000 mg | ORAL_TABLET | Freq: Four times a day (QID) | ORAL | Status: DC
  Administered 2017-07-03 – 2017-07-04 (×6): 600 mg via ORAL
  Filled 2017-07-03 (×6): qty 1

## 2017-07-03 MED ORDER — LIDOCAINE HCL (PF) 1 % IJ SOLN
INTRAMUSCULAR | Status: DC | PRN
  Administered 2017-07-03: 3 mL

## 2017-07-03 MED ORDER — DIPHENHYDRAMINE HCL 50 MG/ML IJ SOLN: 12.5000 mg | INTRAMUSCULAR | Status: DC | PRN

## 2017-07-03 MED ORDER — DIBUCAINE 1 % RE OINT: 1.0000 "application " | TOPICAL_OINTMENT | RECTAL | Status: DC | PRN

## 2017-07-03 MED ORDER — FLEET ENEMA 7-19 GM/118ML RE ENEM: 1.0000 | ENEMA | Freq: Every day | RECTAL | Status: DC | PRN

## 2017-07-03 MED ORDER — LACTATED RINGERS IV SOLN
500.0000 mL | Freq: Once | INTRAVENOUS | Status: DC
Start: 2017-07-03 — End: 2017-07-04

## 2017-07-03 MED ORDER — TETANUS-DIPHTH-ACELL PERTUSSIS 5-2.5-18.5 LF-MCG/0.5 IM SUSP: 0.5000 mL | Freq: Once | INTRAMUSCULAR | Status: DC

## 2017-07-03 MED ORDER — LIDOCAINE-EPINEPHRINE (PF) 1.5 %-1:200000 IJ SOLN
INTRAMUSCULAR | Status: DC | PRN
  Administered 2017-07-03: 3 mL via PERINEURAL

## 2017-07-03 MED ORDER — SIMETHICONE 80 MG PO CHEW: 80.0000 mg | CHEWABLE_TABLET | ORAL | Status: DC | PRN

## 2017-07-03 MED ORDER — SENNOSIDES-DOCUSATE SODIUM 8.6-50 MG PO TABS: 2.0000 | ORAL_TABLET | ORAL | Status: DC

## 2017-07-03 MED ORDER — WITCH HAZEL-GLYCERIN EX PADS: 1.0000 "application " | MEDICATED_PAD | CUTANEOUS | Status: DC | PRN

## 2017-07-03 MED ORDER — BENZOCAINE-MENTHOL 20-0.5 % EX AERO
1.0000 "application " | INHALATION_SPRAY | CUTANEOUS | Status: DC | PRN
  Administered 2017-07-04: 1 via TOPICAL
  Filled 2017-07-03: qty 56

## 2017-07-03 MED ORDER — BUPIVACAINE HCL (PF) 0.25 % IJ SOLN
INTRAMUSCULAR | Status: DC | PRN
  Administered 2017-07-03: 10 mL via EPIDURAL

## 2017-07-03 MED ORDER — PRENATAL MULTIVITAMIN CH
1.0000 | ORAL_TABLET | Freq: Every day | ORAL | Status: DC
  Administered 2017-07-03 – 2017-07-04 (×2): 1 via ORAL
  Filled 2017-07-03 (×2): qty 1

## 2017-07-03 MED ORDER — PHENYLEPHRINE 40 MCG/ML (10ML) SYRINGE FOR IV PUSH (FOR BLOOD PRESSURE SUPPORT)
80.0000 ug | PREFILLED_SYRINGE | INTRAVENOUS | Status: DC | PRN
  Filled 2017-07-03: qty 5

## 2017-07-03 MED ORDER — ZOLPIDEM TARTRATE 5 MG PO TABS: 5.0000 mg | ORAL_TABLET | Freq: Every evening | ORAL | Status: DC | PRN

## 2017-07-03 MED ORDER — EPHEDRINE 5 MG/ML INJ
10.0000 mg | INTRAVENOUS | Status: DC | PRN
  Filled 2017-07-03: qty 2

## 2017-07-03 MED ORDER — METHYLERGONOVINE MALEATE 0.2 MG/ML IJ SOLN
INTRAMUSCULAR | Status: AC
  Filled 2017-07-03: qty 1

## 2017-07-03 MED ORDER — FENTANYL 2.5 MCG/ML W/ROPIVACAINE 0.15% IN NS 100 ML EPIDURAL (ARMC)
EPIDURAL | Status: DC | PRN
  Administered 2017-07-03: 10 mL/h via EPIDURAL

## 2017-07-03 MED ORDER — SODIUM CHLORIDE 0.9% FLUSH: 3.0000 mL | INTRAVENOUS | Status: DC | PRN

## 2017-07-03 MED ORDER — COCONUT OIL OIL: 1.0000 "application " | TOPICAL_OIL | Status: DC | PRN

## 2017-07-03 MED ORDER — CARBOPROST TROMETHAMINE 250 MCG/ML IM SOLN
INTRAMUSCULAR | Status: AC
  Administered 2017-07-03: 250 ug
  Filled 2017-07-03: qty 1

## 2017-07-03 MED ORDER — OXYCODONE HCL 5 MG PO TABS: 5.0000 mg | ORAL_TABLET | ORAL | Status: DC | PRN

## 2017-07-03 MED ORDER — ONDANSETRON HCL 4 MG PO TABS: 4.0000 mg | ORAL_TABLET | ORAL | Status: DC | PRN

## 2017-07-03 MED ORDER — SODIUM CHLORIDE 0.9% FLUSH: 3.0000 mL | Freq: Two times a day (BID) | INTRAVENOUS | Status: DC

## 2017-07-03 MED ORDER — BISACODYL 10 MG RE SUPP: 10.0000 mg | Freq: Every day | RECTAL | Status: DC | PRN

## 2017-07-03 MED ORDER — FENTANYL 2.5 MCG/ML W/ROPIVACAINE 0.15% IN NS 100 ML EPIDURAL (ARMC)
EPIDURAL | Status: AC
  Filled 2017-07-03: qty 100

## 2017-07-03 MED ORDER — DIPHENHYDRAMINE HCL 25 MG PO CAPS
25.0000 mg | ORAL_CAPSULE | Freq: Four times a day (QID) | ORAL | Status: DC | PRN
Start: 2017-07-03 — End: 2017-07-04

## 2017-07-03 MED ORDER — SODIUM CHLORIDE 0.9 % IV SOLN: 250.0000 mL | INTRAVENOUS | Status: DC | PRN

## 2017-07-03 MED ORDER — FENTANYL 2.5 MCG/ML W/ROPIVACAINE 0.15% IN NS 100 ML EPIDURAL (ARMC): 12.0000 mL/h | EPIDURAL | Status: DC

## 2017-07-03 MED ORDER — ACETAMINOPHEN 325 MG PO TABS
650.0000 mg | ORAL_TABLET | ORAL | Status: DC | PRN
  Filled 2017-07-03: qty 2

## 2017-07-03 MED ORDER — MEASLES, MUMPS & RUBELLA VAC ~~LOC~~ INJ
0.5000 mL | INJECTION | Freq: Once | SUBCUTANEOUS | Status: DC
  Filled 2017-07-03: qty 0.5

## 2017-07-03 NOTE — Clinical Social Work Note (Signed)
CLINICAL SOCIAL WORK MATERNAL/CHILD NOTE  Patient Details  Name: Tamara Harrison MRN: [id number] Date of Birth: 08/19/1998  Date:  07/03/2017  Clinical Social Worker Initiating Note:  Ardath Lepak MSW,LCSW Date/Time: Initiated:  07/03/17/      Child's Name:      Biological Parents:  Mother   Need for Interpreter:  None   Reason for Referral:   (concern about cognitive issues/ history of cocaine use)   Address:  [address] [place] [address]    Phone number:  [phone] (home)     Additional phone number: none  Household Members/Support Persons (HM/SP):       HM/SP Name Relationship DOB or Age  HM/SP -1        HM/SP -2        HM/SP -3        HM/SP -4        HM/SP -5        HM/SP -6        HM/SP -7        HM/SP -8          Natural Supports (not living in the home):  Extended Family, Friends   Professional Supports: None   Employment:     Type of Work:     Education:      Homebound arranged:    Financial Resources:  Medicaid   Other Resources:  WIC, Food Stamps    Cultural/Religious Considerations Which May Impact Care:  none Strengths:  Ability to meet basic needs , Home prepared for child    Psychotropic Medications:         Pediatrician:       Pediatrician List:   [place]    [place]    [place]    [place]    [place]    [place]      Pediatrician Fax Number:    Risk Factors/Current Problems:  Substance Use , Mental Health Concerns    Cognitive State:  Alert    Mood/Affect:  Calm    CSW Assessment: CSW met with patient who was initially very lethargic but was able to awaken fully to complete the assessment. Patient gave verbal consent for her boyfriend, her mother, and the friend that she lives with to remain in the room. Patient spoke with an impediment. She was alert and oriented X4. Patient answered questions directed at her and her mother and her friend chimed in from time to  time. Patient stated that she and her boyfriend now live with her friend so that they could get out of an unstable home environment. Patient's mother stated that she did not have room for patient and her boyfriend and newborn and that this was the reason for her living with her friend. Patient confirmed this and stated that she had been living with her father but that her father told her in her junior year of high school that she was no longer allowed to go to school and that she had to find a job. Patient is now 18 and considered an adult. She states her boyfriend and father of baby was friends with her father and she confirmed he is significantly older than her. She states that they have been together for a year and that the relationship is consensual.   Patient states that she has all necessities for her newborn except a bassinet/crib but states she will be able to get that by the time of discharge.   Patient denies any history of mental illness. She states she stopped using marijuana in April and she did not mention cocaine. Patient states she no longer uses and has no intention to use again. CSW explained that the cord tissue was being tested and that if it came back positive, a DSS CPS report would be made. Patient and mother verbalized understanding. CSW will continue to monitor for cord tissue results.    CSW Plan/Description:  CSW Will Continue to Monitor Umbilical Cord Tissue Drug Screen Results and Make Report if Warranted    [name], LCSW 07/03/2017, 11:52 AM  

## 2017-07-03 NOTE — Anesthesia Procedure Notes (Signed)
Epidural Patient location during procedure: OB  Staffing Anesthesiologist: Alvin Critchley Performed: anesthesiologist   Preanesthetic Checklist Completed: patient identified, site marked, surgical consent, pre-op evaluation, timeout performed, IV checked, risks and benefits discussed and monitors and equipment checked  Epidural Patient position: sitting Prep: Betadine Patient monitoring: heart rate, continuous pulse ox and blood pressure Approach: midline Location: L3-L4 Injection technique: LOR air  Needle:  Needle type: Tuohy  Needle gauge: 17 G Needle length: 9 cm and 9 Catheter type: closed end flexible Catheter size: 19 Gauge Test dose: negative and 1.5% lidocaine with Epi 1:200 K  Assessment Sensory level: T8 Events: blood not aspirated, injection not painful, no injection resistance, negative IV test and no paresthesia  Additional Notes Time out called.  Patient placed in sitting position.  Back prepped and draped in sterile fashion.  A skin wheal was made in the L3-L4 interspace with 1% Lidocaine plain.  A 17G Tuohy needle was advanced to the epidural space by a loss of resistance technique.  The epidural catheter was threaded 3 cm and the TD was negative.  No blood or paresthesias. The catheter was affixed to the back in sterile fashion.Reason for block:procedure for pain

## 2017-07-03 NOTE — Anesthesia Preprocedure Evaluation (Signed)
Anesthesia Evaluation  Patient identified by MRN, date of birth, ID band Patient awake    Reviewed: Allergy & Precautions, NPO status , Patient's Chart, lab work & pertinent test results  Airway Mallampati: II  TM Distance: <3 FB     Dental  (+) Teeth Intact   Pulmonary neg pulmonary ROS,    Pulmonary exam normal        Cardiovascular negative cardio ROS Normal cardiovascular exam     Neuro/Psych negative neurological ROS  negative psych ROS   GI/Hepatic negative GI ROS, Neg liver ROS,   Endo/Other  Hypothyroidism Morbid obesity  Renal/GU negative Renal ROS  negative genitourinary   Musculoskeletal negative musculoskeletal ROS (+)   Abdominal Normal abdominal exam  (+)   Peds negative pediatric ROS (+)  Hematology negative hematology ROS (+)   Anesthesia Other Findings   Reproductive/Obstetrics (+) Pregnancy                             Anesthesia Physical Anesthesia Plan  ASA: II  Anesthesia Plan: Epidural   Post-op Pain Management:    Induction:   PONV Risk Score and Plan:   Airway Management Planned: Natural Airway  Additional Equipment:   Intra-op Plan:   Post-operative Plan:   Informed Consent: I have reviewed the patients History and Physical, chart, labs and discussed the procedure including the risks, benefits and alternatives for the proposed anesthesia with the patient or authorized representative who has indicated his/her understanding and acceptance.     Plan Discussed with: CRNA and Surgeon  Anesthesia Plan Comments:         Anesthesia Quick Evaluation

## 2017-07-03 NOTE — Discharge Summary (Signed)
Obstetrical Discharge Summary  Patient Name: Tamara Harrison DOB: [birth year] MRN: [id number]  Date of Admission: 07/02/2017 Date of Discharge: 07/04/2017  Primary OB:  ACHD  Gestational Age at Delivery: [redacted]w[redacted]d   Antepartum complications:  1. Hypothyroid - last TSH 5.540 2. Proteinuria 3. Fibroids 4. Obesity (37.5) 5. Cocaine abuse during pregnancy 6. Trichomoniasis this pregnancy 7. Poor social situation (does not have access to refrigerator at home, parents are not in her life, brother is, she calls another woman "mom" and her boyfriend/FOB her "husband")  Admitting Diagnosis: iol Secondary Diagnosis: Patient Active Problem List   Diagnosis Date Noted  . Labor and delivery indication for care or intervention 06/26/2017  . Pregnancy complicated by obesity 95/62/1308  . Uterine fibroids affecting pregnancy 05/07/2017  . Proteinuria affecting pregnancy 05/07/2017  . Hypothyroid in pregnancy, antepartum 05/07/2017    Augmentation: AROM, Pitocin and Cytotec Complications: [id number] Intrapartum complications/course:  24MW G1P0 at 39+2wks, iol for obesity. During induction, right sided facial drooping noticed. Neuro consult stat and non-contrast head CT neg; dx of likely Bell's palsy made. Pt received epidural and pushed over intact perineum. Baby delivered easily, with a loose nuchal reduced on perineum and baby passed to maternal abdomen. Initial stimulation required for vigorous crying, and baby was taken to the warmer. Prior to delivery of the placenta, brisk bright red bleeding noted from right labial tear and from uterus. The placenta was expressed and manual evacuation of small retained membrane, along with 27mcg of hemabate given IM, slowed bleeding.   Sutures placed interrupted on right labia. Fundus firm, bleeding minimal at close of case. Baby on maternal skin to skin, vigorous and crying. Cord gases collected but not sent.  Date of Delivery: 07/03/17 Delivered By:  Benjaman Kindler  Delivery Type: spontaneous vaginal delivery Anesthesia: epidural Placenta: Expressed Laceration: bilateral labial Episiotomy: none Newborn Data: Live born female "Brisa" Birth Weight: 7 lb 9.3 oz (3440 g) APGAR: 7, 9    Discharge Physical Exam:  BP 128/63 (BP Location: Right Arm)   Pulse 80   Temp 98.1 F (36.7 C) (Oral)   Resp 18   Ht 5\' 6"  (1.676 m)   Wt 123.4 kg (272 lb)   LMP 09/18/2016 (Approximate)   SpO2 98%   Breastfeeding? Unknown   BMI 43.90 kg/m   General: NAD Neuro: facial drooping and numbness present.  CV: RRR Pulm: CTABL, nl effort ABD: s/nd/nt, fundus firm and below the umbilicus Lochia: moderate Incision: c/d/i  DVT Evaluation: LE non-ttp, no evidence of DVT on exam.  Hemoglobin  Date Value Ref Range Status  07/03/2017 10.5 (L) 12.0 - 16.0 g/dL Final   HGB  Date Value Ref Range Status  04/18/2012 13.9 12.0 - 16.0 g/dL Final   HCT  Date Value Ref Range Status  07/03/2017 30.8 (L) 35.0 - 47.0 % Final  04/18/2012 42.1 35.0 - 47.0 % Final    Post partum course: leukocytosis, improved by PPD#1 Acute on chronic anemia: Po iron home with patient  Disposition: stable, discharge to home. Baby Feeding: breastmilk  Baby Disposition: home with mom  Rh Immune globulin given: Rubella vaccine given:  Tdap vaccine given in AP or PP setting: AP Flu vaccine given in AP or PP setting: AP Prenatal Labs:  O+ RI VNI   Plan:  ELIANIE HUBERS was discharged to home in good condition. Follow-up appointment at [facility] in 6 weeks   Discharge Medications: Allergies as of 07/04/2017   No Known Allergies  Medication List    STOP taking these medications   aspirin EC 81 MG tablet     TAKE these medications   docusate sodium 100 MG capsule Commonly known as:  COLACE Take 1 capsule (100 mg total) by mouth 2 (two) times daily. To keep stools soft, as needed   ferrous sulfate 325 (65 FE) MG tablet Take 1 tablet (325  mg total) by mouth daily with breakfast. Take with Vitamin C   ibuprofen 800 MG tablet Commonly known as:  ADVIL,MOTRIN Take 1 tablet (800 mg total) by mouth every 8 (eight) hours as needed for moderate pain or cramping.   levothyroxine 50 MCG tablet Commonly known as:  SYNTHROID Take 1 tablet (50 mcg total) by mouth daily. What changed:  how much to take   prenatal multivitamin Tabs tablet Take 1 tablet by mouth daily at 12 noon.         Signed: Benjaman Kindler 07/04/17

## 2017-07-03 NOTE — Progress Notes (Signed)
Subjective: Patient now postpartum.  Continued facial droop.  NO new symptoms.  Objective: Current vital signs: BP 119/66 (BP Location: Left Arm)   Pulse (!) 117   Temp 98 F (36.7 C) (Oral)   Resp (!) 22   Ht 5\' 6"  (1.676 m)   Wt 123.4 kg (272 lb)   LMP 09/18/2016 (Approximate)   SpO2 99%   Breastfeeding? Unknown   BMI 43.90 kg/m  Vital signs in last 24 hours: Temp:  [97.5 F (36.4 C)-98.9 F (37.2 C)] 98 F (36.7 C) (10/02 0812) Pulse Rate:  [81-138] 117 (10/02 0812) Resp:  [16-22] 22 (10/02 0812) BP: (119-159)/(60-94) 119/66 (10/02 0812) SpO2:  [99 %-100 %] 99 % (10/02 0812)  Intake/Output from previous day: 10/01 0701 - 10/02 0700 In: -  Out: 1800 [Urine:300; Blood:1500] Intake/Output this shift: Total I/O In: 240 [P.O.:240] Out: -  Nutritional status: Diet regular Room service appropriate? Yes; Fluid consistency: Thin  Neurologic Exam: Mental Status: Alert, oriented, thought content appropriate.  Speech fluent without evidence of aphasia.  Able to follow 3 step commands without difficulty. Cranial Nerves: II: Discs flat bilaterally; Visual fields grossly normal, pupils equal, round, reactive to light and accommodation III,IV, VI: ptosis not present, extra-ocular motions intact bilaterally V,VII: right facial droop with decreased right eye closure and absent forehead wrinkling on the right, facial light touch sensation normal bilaterally VIII: hearing normal bilaterally IX,X: gag reflex present XI: bilateral shoulder shrug XII: midline tongue extension Motor: Right :  Upper extremity   5/5                                      Left:     Upper extremity   5/5             Lower extremity   5/5                                                  Lower extremity   5/5 Tone and bulk:normal tone throughout; no atrophy noted   Lab Results: Basic Metabolic Panel:  Recent Labs Lab 07/02/17 0530  NA 136  K 3.6  CL 107  CO2 19*  GLUCOSE 109*  BUN 8  CREATININE  0.54  CALCIUM 9.0    Liver Function Tests:  Recent Labs Lab 07/02/17 0530  AST 30  ALT 20  ALKPHOS 159*  BILITOT 0.4  PROT 6.9  ALBUMIN 2.9*   No results for input(s): LIPASE, AMYLASE in the last 168 hours. No results for input(s): AMMONIA in the last 168 hours.  CBC:  Recent Labs Lab 07/02/17 0530 07/03/17 0814  WBC 10.0 17.2*  HGB 11.9* 10.5*  HCT 34.5* 30.8*  MCV 78.5* 79.7*  PLT 341 336    Cardiac Enzymes: No results for input(s): CKTOTAL, CKMB, CKMBINDEX, TROPONINI in the last 168 hours.  Lipid Panel: No results for input(s): CHOL, TRIG, HDL, CHOLHDL, VLDL, LDLCALC in the last 168 hours.  CBG: No results for input(s): GLUCAP in the last 168 hours.  Microbiology: Results for orders placed or performed during the hospital encounter of 07/02/17  OB RESULT CONSOLE Group B Strep     Status: None   Collection Time: 06/13/17 12:00 AM  Result Value Ref Range Status   GBS Positive  Final    Coagulation Studies: No results for input(s): LABPROT, INR in the last 72 hours.  Imaging: Ct Head Wo Contrast  Result Date: 07/02/2017 CLINICAL DATA:  Right facial droop EXAM: CT HEAD WITHOUT CONTRAST TECHNIQUE: Contiguous axial images were obtained from the base of the skull through the vertex without intravenous contrast. COMPARISON:  None. FINDINGS: Brain: No mass lesion, intraparenchymal hemorrhage or extra-axial collection. No evidence of acute cortical infarct. Brain parenchyma and CSF-containing spaces are normal for age. Vascular: No hyperdense vessel or unexpected calcification. Skull: Normal visualized skull base, calvarium and extracranial soft tissues. Sinuses/Orbits: No sinus fluid levels or advanced mucosal thickening. No mastoid effusion. Normal orbits. IMPRESSION: Normal head CT. Electronically Signed   By: Ulyses Jarred M.D.   On: 07/02/2017 18:52    Medications:  I have reviewed the patient's current medications. Scheduled: . ibuprofen  600 mg Oral Q6H   . levothyroxine  25 mcg Oral QAC breakfast  . [START ON 07/04/2017] measles, mumps and rubella vaccine  0.5 mL Subcutaneous Once  . methylergonovine      . prenatal multivitamin  1 tablet Oral Q1200  . [START ON 07/04/2017] senna-docusate  2 tablet Oral Q24H  . sodium chloride flush  3 mL Intravenous Q12H  . [START ON 07/04/2017] Tdap  0.5 mL Intramuscular Once    Assessment/Plan: Patient postpartum.  Continued facial droop.  Head CT unremarkable. No further imaging indicated at this time.  Exam remains consistent with a Bell's palsy.    No further neurologic intervention is recommended at this time.  If further questions arise, please call or page at that time.  Thank you for allowing neurology to participate in the care of this patient.    LOS: 1 day   Alexis Goodell, MD Neurology [phone] 07/03/2017  10:34 AM

## 2017-07-04 LAB — CBC
HCT: 27.9 % — ABNORMAL LOW (ref 35.0–47.0)
HEMOGLOBIN: 9.2 g/dL — AB (ref 12.0–16.0)
MCH: 26.2 pg (ref 26.0–34.0)
MCHC: 33.1 g/dL (ref 32.0–36.0)
MCV: 79.1 fL — AB (ref 80.0–100.0)
Platelets: 346 10*3/uL (ref 150–440)
RBC: 3.53 MIL/uL — AB (ref 3.80–5.20)
RDW: 14.6 % — ABNORMAL HIGH (ref 11.5–14.5)
WBC: 12.9 10*3/uL — ABNORMAL HIGH (ref 3.6–11.0)

## 2017-07-04 MED ORDER — VARICELLA VIRUS VACCINE LIVE 1350 PFU/0.5ML IJ SUSR
0.5000 mL | Freq: Once | INTRAMUSCULAR | Status: AC
  Administered 2017-07-04: 0.5 mL via SUBCUTANEOUS
  Filled 2017-07-04: qty 0.5

## 2017-07-04 MED ORDER — DOCUSATE SODIUM 100 MG PO CAPS: 100.0000 mg | ORAL_CAPSULE | Freq: Two times a day (BID) | ORAL | 0 refills | Status: AC

## 2017-07-04 MED ORDER — IBUPROFEN 800 MG PO TABS: 800.0000 mg | ORAL_TABLET | Freq: Three times a day (TID) | ORAL | 1 refills | Status: DC | PRN

## 2017-07-04 MED ORDER — FERROUS SULFATE 325 (65 FE) MG PO TABS: 325.0000 mg | ORAL_TABLET | Freq: Every day | ORAL | 1 refills | Status: DC

## 2017-07-04 NOTE — Progress Notes (Signed)
Patient discharged home with infant, significant other and family. Discharge instructions, prescriptions and follow up appointment given to and reviewed with patient, significant other and family. Patient verbalized understanding. Escorted out via wheelchair by nursing staff.

## 2017-07-04 NOTE — Progress Notes (Signed)
RN into patient room to follow up on whether or not she was able to obtain a bassinet/crib for infant for home. Patient states that she has not been able to get one. [place] [employer], to let her know the situation. CSW states that we can provide the patient with a pack and play to go home with. Pack and play given to patient by RN and safe sleep information provided to patient and family.

## 2017-07-04 NOTE — Lactation Note (Signed)
This note was copied from a baby's chart. Lactation Consultation Note  Patient Name: Girl Zannie Locastro Today's Date: 07/04/2017  During rounds, Mom and family member state that baby is nursing "well" and that they have no questions or problems. I did give Mom a pump kit for home use prn. They have [facility] contact info. They had declined LC assessment of feeding today and yesterday, but QS diapers and feeds, etc.    Maternal Data    Feeding Feeding Type: Breast Fed  LATCH Score Latch: Repeated attempts needed to sustain latch, nipple held in mouth throughout feeding, stimulation needed to elicit sucking reflex.  Audible Swallowing: Spontaneous and intermittent  Type of Nipple: Everted at rest and after stimulation  Comfort (Breast/Nipple): Soft / non-tender  Hold (Positioning): Assistance needed to correctly position infant at breast and maintain latch.  LATCH Score: 8  Interventions Interventions: Assisted with latch;Position options;Support pillows  Lactation Tools Discussed/Used     Consult Status      Roque Cash 07/04/2017, 4:09 PM

## 2017-07-04 NOTE — Anesthesia Postprocedure Evaluation (Signed)
Anesthesia Post Note  Patient: Tamara Harrison  Procedure(s) Performed: AN AD [place]  Patient location during evaluation: Mother Baby Anesthesia Type: Epidural Level of consciousness: awake and alert and oriented Pain management: satisfactory to patient Vital Signs Assessment: post-procedure vital signs reviewed and stable Respiratory status: respiratory function stable Cardiovascular status: stable Postop Assessment: no backache, no headache, epidural receding, no apparent nausea or vomiting, adequate PO intake and patient able to bend at knees Anesthetic complications: no     Last Vitals:  Vitals:   07/04/17 0410 07/04/17 0808  BP: 126/74 128/63  Pulse: 91 80  Resp: 18 18  Temp: 36.7 C 36.4 C  SpO2: 98% 98%    Last Pain:  Vitals:   07/04/17 0808  TempSrc: Oral  PainSc:                  [name]

## 2018-02-24 ENCOUNTER — Emergency Department: Payer: Medicaid Other

## 2018-02-24 ENCOUNTER — Other Ambulatory Visit: Payer: Self-pay

## 2018-02-24 ENCOUNTER — Encounter: Payer: Self-pay | Admitting: Emergency Medicine

## 2018-02-24 ENCOUNTER — Emergency Department
Admission: EM | Admit: 2018-02-24 | Discharge: 2018-02-24 | Disposition: A | Discharge status: Home / Self Care | Payer: Medicaid Other | Attending: Emergency Medicine | Admitting: Emergency Medicine

## 2018-02-24 DIAGNOSIS — K5289 Other specified noninfective gastroenteritis and colitis: Secondary | ICD-10-CM | POA: Diagnosis not present

## 2018-02-24 DIAGNOSIS — Z79899 Other long term (current) drug therapy: Secondary | ICD-10-CM | POA: Insufficient documentation

## 2018-02-24 DIAGNOSIS — K529 Noninfective gastroenteritis and colitis, unspecified: Secondary | ICD-10-CM

## 2018-02-24 DIAGNOSIS — R109 Unspecified abdominal pain: Secondary | ICD-10-CM | POA: Diagnosis present

## 2018-02-24 LAB — CBC
HEMATOCRIT: 42.3 % (ref 35.0–47.0)
Hemoglobin: 14.1 g/dL (ref 12.0–16.0)
MCH: 26 pg (ref 26.0–34.0)
MCHC: 33.4 g/dL (ref 32.0–36.0)
MCV: 77.9 fL — AB (ref 80.0–100.0)
Platelets: 437 10*3/uL (ref 150–440)
RBC: 5.43 MIL/uL — ABNORMAL HIGH (ref 3.80–5.20)
RDW: 14.4 % (ref 11.5–14.5)
WBC: 10.6 10*3/uL (ref 3.6–11.0)

## 2018-02-24 LAB — URINALYSIS, COMPLETE (UACMP) WITH MICROSCOPIC
BACTERIA UA: NONE SEEN
BILIRUBIN URINE: NEGATIVE
Glucose, UA: NEGATIVE mg/dL
KETONES UR: NEGATIVE mg/dL
Nitrite: NEGATIVE
Protein, ur: NEGATIVE mg/dL
RBC / HPF: >50 RBC/hpf — ABNORMAL HIGH (ref 0–5)
Specific Gravity, Urine: 1.027 (ref 1.005–1.030)
pH: 6 (ref 5.0–8.0)

## 2018-02-24 LAB — COMPREHENSIVE METABOLIC PANEL
ALBUMIN: 4.1 g/dL (ref 3.5–5.0)
ALK PHOS: 104 U/L (ref 38–126)
ALT: 38 U/L (ref 14–54)
AST: 29 U/L (ref 15–41)
Anion gap: 6 (ref 5–15)
BUN: 11 mg/dL (ref 6–20)
CHLORIDE: 107 mmol/L (ref 101–111)
CO2: 26 mmol/L (ref 22–32)
CREATININE: 0.72 mg/dL (ref 0.44–1.00)
Calcium: 9.4 mg/dL (ref 8.9–10.3)
GFR calc Af Amer: >60 mL/min (ref >60)
GFR calc non Af Amer: >60 mL/min (ref >60)
GLUCOSE: 103 mg/dL — AB (ref 65–99)
POTASSIUM: 3.4 mmol/L — AB (ref 3.5–5.1)
SODIUM: 139 mmol/L (ref 135–145)
Total Bilirubin: 0.3 mg/dL (ref 0.3–1.2)
Total Protein: 8.3 g/dL — ABNORMAL HIGH (ref 6.5–8.1)

## 2018-02-24 LAB — LIPASE, BLOOD: Lipase: 26 U/L (ref 11–51)

## 2018-02-24 LAB — POCT PREGNANCY, URINE: Preg Test, Ur: NEGATIVE

## 2018-02-24 LAB — TSH: TSH: 4.31 u[IU]/mL (ref 0.350–4.500)

## 2018-02-24 MED ORDER — IOPAMIDOL (ISOVUE-300) INJECTION 61%
30.0000 mL | Freq: Once | INTRAVENOUS | Status: AC | PRN
  Administered 2018-02-24: 30 mL via ORAL

## 2018-02-24 MED ORDER — ONDANSETRON HCL 4 MG/2ML IJ SOLN
4.0000 mg | Freq: Once | INTRAMUSCULAR | Status: AC
  Administered 2018-02-24: 4 mg via INTRAVENOUS
  Filled 2018-02-24: qty 2

## 2018-02-24 MED ORDER — IOPAMIDOL (ISOVUE-300) INJECTION 61%
100.0000 mL | Freq: Once | INTRAVENOUS | Status: AC | PRN
  Administered 2018-02-24: 100 mL via INTRAVENOUS

## 2018-02-24 MED ORDER — ONDANSETRON 4 MG PO TBDP: 4.0000 mg | ORAL_TABLET | Freq: Four times a day (QID) | ORAL | 0 refills | Status: DC | PRN

## 2018-02-24 MED ORDER — GI COCKTAIL ~~LOC~~
30.0000 mL | Freq: Once | ORAL | Status: AC
  Administered 2018-02-24: 30 mL via ORAL
  Filled 2018-02-24: qty 30

## 2018-02-24 MED ORDER — ONDANSETRON HCL 4 MG/2ML IJ SOLN
4.0000 mg | Freq: Once | INTRAMUSCULAR | Status: AC
  Administered 2018-02-24: 4 mg via INTRAVENOUS

## 2018-02-24 MED ORDER — MORPHINE SULFATE (PF) 4 MG/ML IV SOLN
4.0000 mg | Freq: Once | INTRAVENOUS | Status: AC
  Administered 2018-02-24: 4 mg via INTRAVENOUS
  Filled 2018-02-24: qty 1

## 2018-02-24 MED ORDER — ONDANSETRON HCL 4 MG/2ML IJ SOLN
INTRAMUSCULAR | Status: AC
  Administered 2018-02-24: 4 mg via INTRAVENOUS
  Filled 2018-02-24: qty 2

## 2018-02-24 NOTE — ED Notes (Signed)
Dr. Quale at bedside.  

## 2018-02-24 NOTE — Discharge Instructions (Signed)
? ?  Please return to the emergency room right away if you are to develop a fever, severe nausea, your pain becomes severe or worsens, you are unable to keep food down, begin vomiting any dark or bloody fluid, you develop any dark or bloody stools, feel dehydrated, or other new concerns or symptoms arise. ? ?

## 2018-02-24 NOTE — ED Notes (Signed)
Pt called out stating she was done with oral contrast. CT notified.

## 2018-02-24 NOTE — ED Notes (Signed)
Pt in CT.

## 2018-02-24 NOTE — ED Notes (Signed)
Patient to waiting room via EMS for vomiting.  Reports some dizziness when she vomits.  EMS reported that vital signs were with in normal limits.

## 2018-02-24 NOTE — ED Provider Notes (Signed)
[facility] Emergency Department Provider Note   ____________________________________________   First MD Initiated Contact with Patient 02/24/18 [phone]     (approximate)  I have reviewed the triage vital signs and the nursing notes.   HISTORY  Chief Complaint Abdominal Pain; Emesis; and Vaginal Bleeding    HPI Tamara Harrison is a 22 y.o. female reports she is been having intermittent pain in the mid to upper abdomen for about the last 2 days after eating.  Not associated with any fevers or chills.  Reports that Friday night after eating dinner she began experiencing pain in her mid upper abdomen, did not radiate to the back.  Said just about in the middle of her upper abdomen.  She did have nausea.  She is experienced the same symptoms off and on for the last day after eating.  She also is experienced a bad taste at times in her mouth that feels like "acid"  Denies any history of major abdominal surgery.  No fevers or chills.  She does report she had vaginal bleeding about 2 pads per day for the last 3 months after Nexplanon placement and has not been able to get a hold of her gynecologist because no one answers the phone number of the clinic she has been calling, sees [place] [facility].  She denies pain in the lower abdomen or pelvis.  No change in her vaginal bleeding that is been persistent for about 3 months.  Denies pregnancy and she is not breast-feeding.  No chest pain or trouble breathing.  Currently her pain is very mild seated primarily in her left upper abdomen.  No recent trips outside the [place].  No bad food exposure.  Past Medical History:  Diagnosis Date  . Thyroid disease     Patient Active Problem List   Diagnosis Date Noted  . Labor and delivery indication for care or intervention 06/26/2017  . Pregnancy complicated by obesity 42/68/3419  . Uterine fibroids affecting pregnancy 05/07/2017  . Proteinuria affecting pregnancy 05/07/2017  .  Hypothyroid in pregnancy, antepartum 05/07/2017    History reviewed. No pertinent surgical history.  Prior to Admission medications   Medication Sig Start Date End Date Taking? Authorizing Provider  ibuprofen (ADVIL,MOTRIN) 800 MG tablet Take 1 tablet (800 mg total) by mouth every 8 (eight) hours as needed for moderate pain or cramping. 07/04/17  Yes Benjaman Kindler, MD  levothyroxine (SYNTHROID) 50 MCG tablet Take 1 tablet (50 mcg total) by mouth daily. Patient taking differently: Take 25 mcg by mouth daily.  05/07/17 05/07/18  Dellia Nims, MD    Allergies Patient has no known allergies.  Family History  Problem Relation Age of Onset  . Diabetes Father     Social History Social History   Tobacco Use  . Smoking status: Never Smoker  . Smokeless tobacco: Never Used  Substance Use Topics  . Alcohol use: No  . Drug use: No    Comment: positive cocaine during pregnancy    Review of Systems Constitutional: No fever/chills Eyes: No visual changes. ENT: No sore throat. Cardiovascular: Denies chest pain. Respiratory: Denies shortness of breath. Gastrointestinal:  No diarrhea.  No constipation. Genitourinary: Negative for dysuria. Musculoskeletal: Negative for back pain. Skin: Negative for rash. Neurological: Negative for headaches, focal weakness or numbness.    ____________________________________________   PHYSICAL EXAM:  VITAL SIGNS: ED Triage Vitals  Enc Vitals Group     BP 02/24/18 0124 138/78     Pulse Rate 02/24/18 0124 87  Resp 02/24/18 0124 17     Temp 02/24/18 0124 97.8 F (36.6 C)     Temp Source 02/24/18 0124 Oral     SpO2 02/24/18 0124 98 %     Weight 02/24/18 0125 273 lb (123.8 kg)     Height 02/24/18 0125 5\' 6"  (1.676 m)     Head Circumference --      Peak Flow --      Pain Score 02/24/18 0125 8     Pain Loc --      Pain Edu? --      Excl. in [place]? --     Constitutional: Alert and oriented. Well appearing and in no acute distress. Eyes:  Conjunctivae are normal. Head: Atraumatic. Nose: No congestion/rhinnorhea. Mouth/Throat: Mucous membranes are moist. Neck: No stridor.   Cardiovascular: Normal rate, regular rhythm. Grossly normal heart sounds.  Good peripheral circulation. Respiratory: Normal respiratory effort.  No retractions. Lungs CTAB. Gastrointestinal: Soft and nontender except in the left upper quadrant where she reports mild tenderness to palpation and also across the epigastrium.  There is no rebound or guarding.  Bedside Percell Miller is negative.  No pain in the right lower quadrant or left lower quadrant. No distention. Musculoskeletal: No lower extremity tenderness nor edema. Neurologic:  Normal speech and language. No gross focal neurologic deficits are appreciated.  Skin:  Skin is warm, dry and intact. No rash noted. Psychiatric: Mood and affect are normal. Speech and behavior are normal.  ____________________________________________   LABS (all labs ordered are listed, but only abnormal results are displayed)  Labs Reviewed  COMPREHENSIVE METABOLIC PANEL - Abnormal; Notable for the following components:      Result Value   Potassium 3.4 (*)    Glucose, Bld 103 (*)    Total Protein 8.3 (*)    All other components within normal limits  CBC - Abnormal; Notable for the following components:   RBC 5.43 (*)    MCV 77.9 (*)    All other components within normal limits  URINALYSIS, COMPLETE (UACMP) WITH MICROSCOPIC - Abnormal; Notable for the following components:   Color, Urine YELLOW (*)    APPearance HAZY (*)    Hgb urine dipstick LARGE (*)    Leukocytes, UA TRACE (*)    RBC / HPF >50 (*)    All other components within normal limits  LIPASE, BLOOD  TSH  POC URINE PREG, ED  POCT PREGNANCY, URINE   ____________________________________________  EKG   ____________________________________________  RADIOLOGY  Ct Abdomen Pelvis W Contrast  Result Date: 02/24/2018 CLINICAL DATA:  Abdominal pain.   Suspect diverticulitis. EXAM: CT ABDOMEN AND PELVIS WITH CONTRAST TECHNIQUE: Multidetector CT imaging of the abdomen and pelvis was performed using the standard protocol following bolus administration of intravenous contrast. CONTRAST:  179mL ISOVUE-300 IOPAMIDOL (ISOVUE-300) INJECTION 61% COMPARISON:  None FINDINGS: Lower chest: Subpleural nodule in the posterior left lower lobe measures 3 mm, image 14/4. Calcified granuloma noted in the right middle lobe. Hepatobiliary: No suspicious liver abnormality. Gallbladder normal. No biliary dilatation. Pancreas: Unremarkable. No pancreatic ductal dilatation or surrounding inflammatory changes. Spleen: Normal in size without focal abnormality. Adrenals/Urinary Tract: Normal appearance of the adrenal glands. No kidney mass or hydronephrosis. Urinary bladder appears normal. Stomach/Bowel: The stomach is normal. Several dilated loops of small bowel noted within the left upper quadrant of the abdomen which measure up to 3.1 cm. There is distal progression of enteric contrast material beyond these dilated loops. The mid and distal small bowel loops have a  normal caliber. The appendix is visualized and appears normal. Normal appearance of the colon. Vascular/Lymphatic: Normal appearance of the abdominal aorta. No enlarged retroperitoneal or mesenteric adenopathy. No enlarged pelvic or inguinal lymph nodes. Reproductive: Partially calcified uterine fibroid measures 3 x 3.4 cm, image 70/2. No adnexal mass. Other: No free fluid, free intraperitoneal air or fluid collections identified. Small fat containing umbilical hernia. Musculoskeletal: No aggressive lytic or sclerotic bone lesions. IMPRESSION: 1. There are several dilated loops of small bowel within the left upper quadrant of the abdomen. Favor focal enteritis. Less likely partial obstruction. The remaining bowel loops are normal. The appendix is visualized and is unremarkable. 2. Uterine fibroid. 3. 3 mm left lower lobe  subpleural nodule. No follow-up needed if patient is low-risk. Non-contrast chest CT can be considered in 12 months if patient is high-risk. This recommendation follows the consensus statement: Guidelines for Management of Incidental Pulmonary Nodules Detected on CT Images: From the Fleischner Society 2017; Radiology 2017; 284:228-243. Electronically Signed   By: Kerby Moors M.D.   On: 02/24/2018 10:01    CT result reviewed as above.  Patient is a non-smoker, no personal history of any cancer, and is a non-smoker. CT scan demonstrates several dilated loops of small bowel in the left upper quadrant.  Likely focal enteritis.  No obvious obstruction, though could represent partial obstruction.  However, the patient has since been able to tolerate her oral contrast without difficulty, she reports she is continue to have bowel movements and passing gas normally lowering my suspicion for obstruction and likely this represents enteritis. ____________________________________________   PROCEDURES  Procedure(s) performed: None  Procedures  Critical Care performed: No  ____________________________________________   INITIAL IMPRESSION / ASSESSMENT AND PLAN / ED COURSE  Pertinent labs & imaging results that were available during my care of the patient were reviewed by me and considered in my medical decision making (see chart for details).  Differential diagnosis includes but is not limited to, abdominal perforation, aortic dissection, cholecystitis, appendicitis, diverticulitis, colitis, esophagitis/gastritis, kidney stone, pyelonephritis, urinary tract infection, aortic aneurysm. All are considered in decision and treatment plan. Based upon the patient's presentation and risk factors, primarily reports epigastric pain left upper quadrant discomfort that seems to be postprandial in nature.  Lab work including LFTs are very reassuring.  Her hemoglobin is stable.  We will proceed with CT scan to further  evaluate for etiology given the left-sided pain, and also wish to evaluate for potential biliary issue and hepatobiliary disease.  Certainly cholelithiasis could have a similar presentation, also acid reflux, I feel less likely diverticulitis, infectious etiology or potential perforation, etc.  Denies pelvic symptoms, except she has had vaginal bleeding for about 3 months time without change.  I spoke with Dr. Leonides Schanz about this and she recommends adding a TSH which they will follow-up in the clinic as the patient does have a history of thyroid disease.  In particular no signs or symptoms suggest hemorrhagic cyst, torsion, etc.     Clinical Course as of Feb 24 1217  Sun Feb 24, 2018  0848 Vaginal bleeding discussed with Dr. Leonides Schanz, she reports the clinic will call the patient for a visit this coming week.  Dr. Leonides Schanz knows the patient from previous.   [MQ]    Clinical Course User Index [MQ] Delman Kitten, MD   Likely enteritis of a probable viral nature or food poisoning is my thought.   ----------------------------------------- 12:14 PM on 02/24/2018 -----------------------------------------  Patient tolerating by mouth well.  Reports  her nausea is improved, discussed CT scan findings and very careful return precautions with the patient.  Discussed with interpreter Barnie Alderman also present.  Patient is agreeable with plan, will discharge home with Zofran and close follow-up with careful return precautions discussed.  ____________________________________________   FINAL CLINICAL IMPRESSION(S) / ED DIAGNOSES  Final diagnoses:  Enteritis      NEW MEDICATIONS STARTED DURING THIS VISIT:  New Prescriptions   No medications on file     Note:  This document was prepared using Dragon voice recognition software and may include unintentional dictation errors.     Delman Kitten, MD 02/24/18 1218

## 2018-02-24 NOTE — ED Notes (Signed)
Pt returned from CT °

## 2018-02-24 NOTE — ED Triage Notes (Addendum)
Pt arrived via EMS with c/o epigastric pain since Friday, constant but varying in severity; pain worse after eating; pt says she has nausea but no vomiting; burning sensation to throat when she feels nauseated; pt adds she has been vaginally bleeding since February 20th; pt says she has to change pad or tampon 3 times a day; has Nexplanon; pt says she's tried to call her doctor to make and appointment but nobody picks up the phone;

## 2018-02-24 NOTE — ED Notes (Signed)
Pt ambulatory to toilet and back by self.  

## 2018-03-01 ENCOUNTER — Emergency Department
Admission: EM | Admit: 2018-03-01 | Discharge: 2018-03-01 | Disposition: A | Discharge status: Home / Self Care | Payer: Medicaid Other | Attending: Emergency Medicine | Admitting: Emergency Medicine

## 2018-03-01 ENCOUNTER — Emergency Department: Payer: Medicaid Other

## 2018-03-01 DIAGNOSIS — Z79899 Other long term (current) drug therapy: Secondary | ICD-10-CM | POA: Diagnosis not present

## 2018-03-01 DIAGNOSIS — K529 Noninfective gastroenteritis and colitis, unspecified: Secondary | ICD-10-CM | POA: Diagnosis not present

## 2018-03-01 DIAGNOSIS — R1011 Right upper quadrant pain: Secondary | ICD-10-CM

## 2018-03-01 LAB — CBC
HCT: 40.6 % (ref 35.0–47.0)
Hemoglobin: 13.6 g/dL (ref 12.0–16.0)
MCH: 26.1 pg (ref 26.0–34.0)
MCHC: 33.5 g/dL (ref 32.0–36.0)
MCV: 77.8 fL — ABNORMAL LOW (ref 80.0–100.0)
Platelets: 405 10*3/uL (ref 150–440)
RBC: 5.22 MIL/uL — AB (ref 3.80–5.20)
RDW: 14.5 % (ref 11.5–14.5)
WBC: 9.2 10*3/uL (ref 3.6–11.0)

## 2018-03-01 LAB — COMPREHENSIVE METABOLIC PANEL
ALT: 30 U/L (ref 14–54)
AST: 27 U/L (ref 15–41)
Albumin: 4.1 g/dL (ref 3.5–5.0)
Alkaline Phosphatase: 101 U/L (ref 38–126)
Anion gap: 9 (ref 5–15)
BILIRUBIN TOTAL: 0.3 mg/dL (ref 0.3–1.2)
BUN: 11 mg/dL (ref 6–20)
CALCIUM: 8.8 mg/dL — AB (ref 8.9–10.3)
CO2: 24 mmol/L (ref 22–32)
CREATININE: 0.65 mg/dL (ref 0.44–1.00)
Chloride: 104 mmol/L (ref 101–111)
GFR calc non Af Amer: >60 mL/min (ref >60)
Glucose, Bld: 119 mg/dL — ABNORMAL HIGH (ref 65–99)
Potassium: 3.7 mmol/L (ref 3.5–5.1)
Sodium: 137 mmol/L (ref 135–145)
TOTAL PROTEIN: 7.9 g/dL (ref 6.5–8.1)

## 2018-03-01 LAB — URINALYSIS, COMPLETE (UACMP) WITH MICROSCOPIC
BILIRUBIN URINE: NEGATIVE
GLUCOSE, UA: NEGATIVE mg/dL
KETONES UR: NEGATIVE mg/dL
NITRITE: NEGATIVE
PH: 6 (ref 5.0–8.0)
Protein, ur: NEGATIVE mg/dL
SPECIFIC GRAVITY, URINE: 1.021 (ref 1.005–1.030)

## 2018-03-01 LAB — LIPASE, BLOOD: LIPASE: 30 U/L (ref 11–51)

## 2018-03-01 LAB — POCT PREGNANCY, URINE: Preg Test, Ur: NEGATIVE

## 2018-03-01 MED ORDER — ONDANSETRON 4 MG PO TBDP: 4.0000 mg | ORAL_TABLET | Freq: Three times a day (TID) | ORAL | 0 refills | Status: DC | PRN

## 2018-03-01 MED ORDER — ONDANSETRON 4 MG PO TBDP
4.0000 mg | ORAL_TABLET | Freq: Once | ORAL | Status: AC | PRN
  Administered 2018-03-01: 4 mg via ORAL
  Filled 2018-03-01: qty 1

## 2018-03-01 NOTE — ED Provider Notes (Signed)
[facility] Emergency Department Provider Note    First MD Initiated Contact with Patient 03/01/18 [phone]     (approximate)  I have reviewed the triage vital signs and the nursing notes.   HISTORY  Chief Complaint Abdominal Pain    HPI Tamara Harrison is a 22 y.o. female with below list of chronic medical conditions presents to the emergency department secondary to acute onset of upper abdominal pain which began after eating tonight.  Patient also complains of nausea vomiting and diarrhea since onset of pain.  She denies any fever afebrile on presentation.    Past Medical History:  Diagnosis Date  . Thyroid disease     Patient Active Problem List   Diagnosis Date Noted  . Labor and delivery indication for care or intervention 06/26/2017  . Pregnancy complicated by obesity 51/11/5850  . Uterine fibroids affecting pregnancy 05/07/2017  . Proteinuria affecting pregnancy 05/07/2017  . Hypothyroid in pregnancy, antepartum 05/07/2017    History reviewed. No pertinent surgical history.  Prior to Admission medications   Medication Sig Start Date End Date Taking? Authorizing Provider  ibuprofen (ADVIL,MOTRIN) 800 MG tablet Take 1 tablet (800 mg total) by mouth every 8 (eight) hours as needed for moderate pain or cramping. 07/04/17   Benjaman Kindler, MD  levothyroxine (SYNTHROID) 50 MCG tablet Take 1 tablet (50 mcg total) by mouth daily. Patient taking differently: Take 25 mcg by mouth daily.  05/07/17 05/07/18  Dellia Nims, MD  ondansetron (ZOFRAN ODT) 4 MG disintegrating tablet Take 1 tablet (4 mg total) by mouth every 6 (six) hours as needed for nausea or vomiting. 02/24/18   Delman Kitten, MD  ondansetron (ZOFRAN ODT) 4 MG disintegrating tablet Take 1 tablet (4 mg total) by mouth every 8 (eight) hours as needed for nausea or vomiting. 03/01/18   Gregor Hams, MD    Allergies No known drug allergies  Family History  Problem Relation Age of Onset  .  Diabetes Father     Social History Social History   Tobacco Use  . Smoking status: Never Smoker  . Smokeless tobacco: Never Used  Substance Use Topics  . Alcohol use: No  . Drug use: No    Comment: positive cocaine during pregnancy    Review of Systems Constitutional: No fever/chills Eyes: No visual changes. ENT: No sore throat. Cardiovascular: Denies chest pain. Respiratory: Denies shortness of breath. Gastrointestinal: No abdominal pain.  No nausea, no vomiting.  No diarrhea.  No constipation. Genitourinary: Negative for dysuria. Musculoskeletal: Negative for neck pain.  Negative for back pain. Integumentary: Negative for rash. Neurological: Negative for headaches, focal weakness or numbness.   ____________________________________________   PHYSICAL EXAM:  VITAL SIGNS: ED Triage Vitals  Enc Vitals Group     BP 03/01/18 0005 (!) 148/88     Pulse Rate 03/01/18 0005 95     Resp 03/01/18 0005 17     Temp 03/01/18 0005 98 F (36.7 C)     Temp Source 03/01/18 0005 Oral     SpO2 03/01/18 0005 98 %     Weight 03/01/18 0006 123.8 kg (273 lb)     Height --      Head Circumference --      Peak Flow --      Pain Score 03/01/18 0006 9     Pain Loc --      Pain Edu? --      Excl. in [place]? --     Constitutional: Alert and  oriented. Well appearing and in no acute distress. Eyes: Conjunctivae are normal.  Head: Atraumatic. Mouth/Throat: Mucous membranes are moist.  Oropharynx non-erythematous. Neck: No stridor.   Cardiovascular: Normal rate, regular rhythm. Good peripheral circulation. Grossly normal heart sounds. Respiratory: Normal respiratory effort.  No retractions. Lungs CTAB. Gastrointestinal: Epigastric tenderness palpation no distention.  Musculoskeletal: No lower extremity tenderness nor edema. No gross deformities of extremities. Neurologic:  Normal speech and language. No gross focal neurologic deficits are appreciated.  Skin:  Skin is warm, dry and intact. No  rash noted. Psychiatric: Mood and affect are normal. Speech and behavior are normal.  ____________________________________________   LABS (all labs ordered are listed, but only abnormal results are displayed)  Labs Reviewed  COMPREHENSIVE METABOLIC PANEL - Abnormal; Notable for the following components:      Result Value   Glucose, Bld 119 (*)    Calcium 8.8 (*)    All other components within normal limits  CBC - Abnormal; Notable for the following components:   RBC 5.22 (*)    MCV 77.8 (*)    All other components within normal limits  URINALYSIS, COMPLETE (UACMP) WITH MICROSCOPIC - Abnormal; Notable for the following components:   Color, Urine YELLOW (*)    APPearance HAZY (*)    Hgb urine dipstick LARGE (*)    Leukocytes, UA SMALL (*)    Bacteria, UA RARE (*)    All other components within normal limits  LIPASE, BLOOD  POC URINE PREG, ED  POCT PREGNANCY, URINE   ______  RADIOLOGY I, [place] Fawnda Vitullo, personally viewed and evaluated these images (plain radiographs) as part of my medical decision making, as well as reviewing the written report by the radiologist.  ED MD interpretation: No acute findings noted on right upper quadrant ultrasound.  Official radiology report(s): US Abdomen Limited Ruq  Result Date: 03/01/2018 CLINICAL DATA:  22 y/o  F; right upper quadrant pain for 12 hours. EXAM: ULTRASOUND ABDOMEN LIMITED RIGHT UPPER QUADRANT COMPARISON:  02/24/2018 CT abdomen and pelvis. FINDINGS: Gallbladder: Partially contracted with limited visualization. No stone seen. Borderline gallbladder wall thickening is probably due to contraction. No pericholecystic fluid. Negative sonographic Murphy's sign. Common bile duct: Diameter: 2 mm Liver: No focal lesion identified. Within normal limits in parenchymal echogenicity. Portal vein is patent on color Doppler imaging with normal direction of blood flow towards the liver. IMPRESSION: Partially contracted gallbladder. Borderline  gallbladder wall thickening is probably due to the bladder being contracted, unlikely cholecystitis. No secondary signs of acute cholecystitis. Electronically Signed   By: Kristine Garbe M.D.   On: 03/01/2018 02:55    ___________________  Procedures   ____________________________________________   INITIAL IMPRESSION / ASSESSMENT AND PLAN / ED COURSE  As part of my medical decision making, I reviewed the following data within the electronic MEDICAL RECORD NUMBER   22 year old female presented with above-stated history and physical exam secondary to upper abdominal discomfort following eating.  Review of the patient's chart revealed a similar presentation on 02/24/2018 at which point a CT scan was performed which revealed no acute intra-abdominal pathology.  Right upper quadrant ultrasound was performed today secondary to concern for possible gallbladder disease however that ultrasound was normal.  Therefore suspect the patient's symptoms may be secondary to gallbladder disease versus enteritis.  Patient will be referred from her care provider for further outpatient evaluation as needed. ____________________________________________  FINAL CLINICAL IMPRESSION(S) / ED DIAGNOSES  Final diagnoses:  RUQ pain  Gastroenteritis     MEDICATIONS GIVEN  DURING THIS VISIT:  Medications  ondansetron (ZOFRAN-ODT) disintegrating tablet 4 mg (4 mg Oral Given 03/01/18 0011)     ED Discharge Orders        Ordered    ondansetron (ZOFRAN ODT) 4 MG disintegrating tablet  Every 8 hours PRN     03/01/18 0357       Note:  This document was prepared using Dragon voice recognition software and may include unintentional dictation errors.    Gregor Hams, MD 03/01/18 2251

## 2018-03-01 NOTE — ED Notes (Signed)
Pt reports having abdominal pain that worsens with walking and eating. Pt was having dinner tonight and after a couple bites of food she went to the bathroom and had to vomit. She is also having upper abdominal pain which radiates to her left side. Pt is alert and oriented x 4. Family at the bedside.

## 2018-03-01 NOTE — ED Triage Notes (Signed)
Patient c/o lower abdominal pain that worsens with walking and eating. Patient c/o N/V/D.

## 2018-06-01 ENCOUNTER — Emergency Department
Admission: EM | Admit: 2018-06-01 | Discharge: 2018-06-01 | Disposition: A | Discharge status: Home / Self Care | Payer: Medicaid Other | Attending: Emergency Medicine | Admitting: Emergency Medicine

## 2018-06-01 ENCOUNTER — Other Ambulatory Visit: Payer: Self-pay

## 2018-06-01 DIAGNOSIS — H6092 Unspecified otitis externa, left ear: Secondary | ICD-10-CM | POA: Insufficient documentation

## 2018-06-01 DIAGNOSIS — H60502 Unspecified acute noninfective otitis externa, left ear: Secondary | ICD-10-CM

## 2018-06-01 DIAGNOSIS — Z79899 Other long term (current) drug therapy: Secondary | ICD-10-CM | POA: Insufficient documentation

## 2018-06-01 MED ORDER — CIPROFLOXACIN-DEXAMETHASONE 0.3-0.1 % OT SUSP
4.0000 [drp] | Freq: Once | OTIC | Status: AC
Start: 2018-06-01 — End: 2018-06-01
  Administered 2018-06-01: 4 [drp] via OTIC
  Filled 2018-06-01: qty 7.5

## 2018-06-01 NOTE — ED Provider Notes (Signed)
[facility] Emergency Department Provider Note  ____________________________________________  Time seen: Approximately 6:49 PM  I have reviewed the triage vital signs and the nursing notes.   HISTORY  Chief Complaint Otalgia    HPI Tamara Harrison is a 22 y.o. female presents to the emergency department with 8/10 aching and sore left ear pain for the past week.  Patient reports that she has pain when she lies on her left side.  She has noticed no discharge from the ear or changes in hearing.  No fever or chills.  Patient denies swimming recently or history of diabetes.  No alleviating measures of been attempted.    Past Medical History:  Diagnosis Date  . Thyroid disease     Patient Active Problem List   Diagnosis Date Noted  . Labor and delivery indication for care or intervention 06/26/2017  . Pregnancy complicated by obesity 16/60/6301  . Uterine fibroids affecting pregnancy 05/07/2017  . Proteinuria affecting pregnancy 05/07/2017  . Hypothyroid in pregnancy, antepartum 05/07/2017    History reviewed. No pertinent surgical history.  Prior to Admission medications   Medication Sig Start Date End Date Taking? Authorizing Provider  ibuprofen (ADVIL,MOTRIN) 800 MG tablet Take 1 tablet (800 mg total) by mouth every 8 (eight) hours as needed for moderate pain or cramping. 07/04/17   Benjaman Kindler, MD  levothyroxine (SYNTHROID) 50 MCG tablet Take 1 tablet (50 mcg total) by mouth daily. Patient taking differently: Take 25 mcg by mouth daily.  05/07/17 05/07/18  Dellia Nims, MD  ondansetron (ZOFRAN ODT) 4 MG disintegrating tablet Take 1 tablet (4 mg total) by mouth every 6 (six) hours as needed for nausea or vomiting. 02/24/18   Delman Kitten, MD  ondansetron (ZOFRAN ODT) 4 MG disintegrating tablet Take 1 tablet (4 mg total) by mouth every 8 (eight) hours as needed for nausea or vomiting. 03/01/18   Gregor Hams, MD    Allergies Patient has no known  allergies.  Family History  Problem Relation Age of Onset  . Diabetes Father     Social History Social History   Tobacco Use  . Smoking status: Never Smoker  . Smokeless tobacco: Never Used  Substance Use Topics  . Alcohol use: No  . Drug use: No    Comment: positive cocaine during pregnancy     Review of Systems  Constitutional: No fever/chills Eyes: No visual changes. No discharge ENT: Patient has left ear pain.  Cardiovascular: no chest pain. Respiratory: no cough. No SOB. Gastrointestinal: No abdominal pain.  No nausea, no vomiting.  No diarrhea.  No constipation. Musculoskeletal: Negative for musculoskeletal pain. Skin: Negative for rash, abrasions, lacerations, ecchymosis. Neurological: Negative for headaches, focal weakness or numbness.   ____________________________________________   PHYSICAL EXAM:  VITAL SIGNS: ED Triage Vitals  Enc Vitals Group     BP 06/01/18 1728 (!) 159/77     Pulse Rate 06/01/18 1728 (!) 112     Resp 06/01/18 1728 18     Temp 06/01/18 1728 98.7 F (37.1 C)     Temp Source 06/01/18 1728 Oral     SpO2 06/01/18 1728 97 %     Weight 06/01/18 1727 278 lb (126.1 kg)     Height 06/01/18 1727 5\' 6"  (1.676 m)     Head Circumference --      Peak Flow --      Pain Score 06/01/18 1727 10     Pain Loc --      Pain Edu? --  Excl. in [place]? --      Constitutional: Alert and oriented. Well appearing and in no acute distress. Eyes: Conjunctivae are normal. PERRL. EOMI. Head: Atraumatic. ENT:      Ears: Patient has reproducible pain to palpation of the left tragus.  Left TM is pearly.  Right TM is pearly.      Nose: No congestion/rhinnorhea.      Mouth/Throat: Mucous membranes are moist.  Neck: No stridor.  No cervical spine tenderness to palpation. Cardiovascular: Normal rate, regular rhythm. Normal S1 and S2.  Good peripheral circulation. Respiratory: Normal respiratory effort without tachypnea or retractions. Lungs CTAB. Good air  entry to the bases with no decreased or absent breath sounds. Gastrointestinal: Bowel sounds 4 quadrants. Soft and nontender to palpation. No guarding or rigidity. No palpable masses. No distention. No CVA tenderness. Musculoskeletal: Full range of motion to all extremities. No gross deformities appreciated. Neurologic:  Normal speech and language. No gross focal neurologic deficits are appreciated.  Skin:  Skin is warm, dry and intact. No rash noted. Psychiatric: Mood and affect are normal. Speech and behavior are normal. Patient exhibits appropriate insight and judgement.   ____________________________________________   LABS (all labs ordered are listed, but only abnormal results are displayed)  Labs Reviewed - No data to display ____________________________________________  EKG   ____________________________________________  RADIOLOGY   No results found.  ____________________________________________    PROCEDURES  Procedure(s) performed:    Procedures    Medications  ciprofloxacin-dexamethasone (CIPRODEX) 0.3-0.1 % OTIC (EAR) suspension 4 drop (4 drops Left EAR Given 06/01/18 1831)     ____________________________________________   INITIAL IMPRESSION / ASSESSMENT AND PLAN / ED COURSE  Pertinent labs & imaging results that were available during my care of the patient were reviewed by me and considered in my medical decision making (see chart for details).  Review of the [place] CSRS was performed in accordance of the [place] prior to dispensing any controlled drugs.      Otitis externa Patient presents to the emergency department with left ear pain for the past week.  History and physical exam findings were consistent with otitis externa.  Patient was treated empirically with Ciprodex.  She was advised to follow-up with primary care as needed.  All patient questions were answered.    ____________________________________________  FINAL CLINICAL IMPRESSION(S) /  ED DIAGNOSES  Final diagnoses:  Acute otitis externa of left ear, unspecified type      NEW MEDICATIONS STARTED DURING THIS VISIT:  ED Discharge Orders    None          This chart was dictated using voice recognition software/Dragon. Despite best efforts to proofread, errors can occur which can change the meaning. Any change was purely unintentional.    Karren Cobble 06/01/18 1854    Orbie Pyo, MD 06/01/18 [phone]

## 2018-06-01 NOTE — ED Triage Notes (Signed)
Pt arrives alert, oriented, ambulatory. C/o L ear pain x 6 days. Denies fever or ear drainage.

## 2018-06-01 NOTE — Discharge Instructions (Signed)
Apply 4 drops in left ear twice daily for the next seven days.  Apply 4 drops once tonight.

## 2019-07-18 ENCOUNTER — Ambulatory Visit: Payer: Self-pay

## 2020-02-18 ENCOUNTER — Other Ambulatory Visit: Payer: Self-pay

## 2020-02-18 ENCOUNTER — Encounter: Payer: Self-pay | Admitting: Emergency Medicine

## 2020-02-18 ENCOUNTER — Emergency Department: Payer: Medicaid Other

## 2020-02-18 ENCOUNTER — Emergency Department
Admission: EM | Admit: 2020-02-18 | Discharge: 2020-02-18 | Disposition: A | Discharge status: Home / Self Care | Payer: Medicaid Other | Attending: Emergency Medicine | Admitting: Emergency Medicine

## 2020-02-18 DIAGNOSIS — R112 Nausea with vomiting, unspecified: Secondary | ICD-10-CM | POA: Diagnosis not present

## 2020-02-18 DIAGNOSIS — R1013 Epigastric pain: Secondary | ICD-10-CM

## 2020-02-18 DIAGNOSIS — R1011 Right upper quadrant pain: Secondary | ICD-10-CM

## 2020-02-18 DIAGNOSIS — R1084 Generalized abdominal pain: Secondary | ICD-10-CM | POA: Diagnosis present

## 2020-02-18 DIAGNOSIS — Z20822 Contact with and (suspected) exposure to covid-19: Secondary | ICD-10-CM | POA: Insufficient documentation

## 2020-02-18 LAB — COMPREHENSIVE METABOLIC PANEL
ALT: 24 U/L (ref 0–44)
AST: 22 U/L (ref 15–41)
Albumin: 4 g/dL (ref 3.5–5.0)
Alkaline Phosphatase: 86 U/L (ref 38–126)
Anion gap: 6 (ref 5–15)
BUN: 12 mg/dL (ref 6–20)
CO2: 26 mmol/L (ref 22–32)
Calcium: 8.5 mg/dL — ABNORMAL LOW (ref 8.9–10.3)
Chloride: 106 mmol/L (ref 98–111)
Creatinine, Ser: 0.7 mg/dL (ref 0.44–1.00)
GFR calc Af Amer: >60 mL/min (ref >60)
GFR calc non Af Amer: >60 mL/min (ref >60)
Glucose, Bld: 109 mg/dL — ABNORMAL HIGH (ref 70–99)
Potassium: 4 mmol/L (ref 3.5–5.1)
Sodium: 138 mmol/L (ref 135–145)
Total Bilirubin: 1 mg/dL (ref 0.3–1.2)
Total Protein: 7.7 g/dL (ref 6.5–8.1)

## 2020-02-18 LAB — CBC WITH DIFFERENTIAL/PLATELET
Abs Immature Granulocytes: 0.05 10*3/uL (ref 0.00–0.07)
Basophils Absolute: 0 10*3/uL (ref 0.0–0.1)
Basophils Relative: 0 %
Eosinophils Absolute: 0.1 10*3/uL (ref 0.0–0.5)
Eosinophils Relative: 1 %
HCT: 42.5 % (ref 36.0–46.0)
Hemoglobin: 14.1 g/dL (ref 12.0–15.0)
Immature Granulocytes: 0 %
Lymphocytes Relative: 7 %
Lymphs Abs: 0.8 10*3/uL (ref 0.7–4.0)
MCH: 28.5 pg (ref 26.0–34.0)
MCHC: 33.2 g/dL (ref 30.0–36.0)
MCV: 86 fL (ref 80.0–100.0)
Monocytes Absolute: 0.4 10*3/uL (ref 0.1–1.0)
Monocytes Relative: 3 %
Neutro Abs: 11.1 10*3/uL — ABNORMAL HIGH (ref 1.7–7.7)
Neutrophils Relative %: 89 %
Platelets: 441 10*3/uL — ABNORMAL HIGH (ref 150–400)
RBC: 4.94 MIL/uL (ref 3.87–5.11)
RDW: 12.7 % (ref 11.5–15.5)
WBC: 12.4 10*3/uL — ABNORMAL HIGH (ref 4.0–10.5)
nRBC: 0 % (ref 0.0–0.2)

## 2020-02-18 LAB — URINALYSIS, COMPLETE (UACMP) WITH MICROSCOPIC
Bacteria, UA: NONE SEEN
Bilirubin Urine: NEGATIVE
Glucose, UA: NEGATIVE mg/dL
Ketones, ur: NEGATIVE mg/dL
Leukocytes,Ua: NEGATIVE
Nitrite: NEGATIVE
Protein, ur: NEGATIVE mg/dL
Specific Gravity, Urine: 1.028 (ref 1.005–1.030)
WBC, UA: NONE SEEN WBC/hpf (ref 0–5)
pH: 5 (ref 5.0–8.0)

## 2020-02-18 LAB — POC SARS CORONAVIRUS 2 AG -  ED: SARS Coronavirus 2 Ag: NEGATIVE

## 2020-02-18 LAB — LIPASE, BLOOD: Lipase: 21 U/L (ref 11–51)

## 2020-02-18 LAB — POCT PREGNANCY, URINE: Preg Test, Ur: NEGATIVE

## 2020-02-18 MED ORDER — KETOROLAC TROMETHAMINE 30 MG/ML IJ SOLN
15.0000 mg | Freq: Once | INTRAMUSCULAR | Status: AC
  Administered 2020-02-18: 15 mg via INTRAVENOUS
  Filled 2020-02-18: qty 1

## 2020-02-18 MED ORDER — ONDANSETRON HCL 4 MG/2ML IJ SOLN
4.0000 mg | Freq: Once | INTRAMUSCULAR | Status: AC
  Administered 2020-02-18: 4 mg via INTRAVENOUS

## 2020-02-18 MED ORDER — ONDANSETRON 4 MG PO TBDP
4.0000 mg | ORAL_TABLET | Freq: Three times a day (TID) | ORAL | 0 refills | Status: DC | PRN
Start: 2020-02-18 — End: 2021-03-02

## 2020-02-18 MED ORDER — DICYCLOMINE HCL 10 MG PO CAPS: 20.0000 mg | ORAL_CAPSULE | Freq: Three times a day (TID) | ORAL | 0 refills | Status: DC

## 2020-02-18 MED ORDER — LIDOCAINE VISCOUS HCL 2 % MT SOLN
15.0000 mL | Freq: Once | OROMUCOSAL | Status: AC
  Administered 2020-02-18: 15 mL via ORAL
  Filled 2020-02-18: qty 15

## 2020-02-18 MED ORDER — MORPHINE SULFATE (PF) 4 MG/ML IV SOLN
4.0000 mg | Freq: Once | INTRAVENOUS | Status: AC
  Administered 2020-02-18: 4 mg via INTRAVENOUS

## 2020-02-18 MED ORDER — SODIUM CHLORIDE 0.9 % IV BOLUS
1000.0000 mL | Freq: Once | INTRAVENOUS | Status: AC
  Administered 2020-02-18: 1000 mL via INTRAVENOUS

## 2020-02-18 MED ORDER — MORPHINE SULFATE (PF) 4 MG/ML IV SOLN
INTRAVENOUS | Status: AC
  Filled 2020-02-18: qty 1

## 2020-02-18 MED ORDER — ONDANSETRON HCL 4 MG/2ML IJ SOLN
INTRAMUSCULAR | Status: AC
  Filled 2020-02-18: qty 2

## 2020-02-18 MED ORDER — ALUM & MAG HYDROXIDE-SIMETH 200-200-20 MG/5ML PO SUSP
30.0000 mL | Freq: Once | ORAL | Status: AC
  Administered 2020-02-18: 30 mL via ORAL
  Filled 2020-02-18: qty 30

## 2020-02-18 NOTE — ED Notes (Signed)
Pt states abd pain that started yesterday, with emesis x 10 episodes.

## 2020-02-18 NOTE — ED Provider Notes (Signed)
Assumed care from Dr. Kerman Passey at 7 AM. Briefly, the patient is a 24 y.o. female with PMHx of  has a past medical history of Thyroid disease. here with abdominal pain, epigastric, with n/v. CT stone study unremarkable. Labs reassuring, mild leukocytosis noted but normal renal function, LFTs. CT does show possible gallstones so RUQ U/S ordered, plan to reassess after meds.   Labs Reviewed  CBC WITH DIFFERENTIAL/PLATELET - Abnormal; Notable for the following components:      Result Value   WBC 12.4 (*)    Platelets 441 (*)    Neutro Abs 11.1 (*)    All other components within normal limits  COMPREHENSIVE METABOLIC PANEL - Abnormal; Notable for the following components:   Glucose, Bld 109 (*)    Calcium 8.5 (*)    All other components within normal limits  POC SARS CORONAVIRUS 2 AG -  ED - Normal  LIPASE, BLOOD  URINALYSIS, COMPLETE (UACMP) WITH MICROSCOPIC  POCT PREGNANCY, URINE  POC URINE PREG, ED    Course of Care: Lab work is very reassuring.  Ultrasound negative.  Possible small nondescript focal fat versus nodule noted, no specific follow-up recommended.  No gallstones.  Patient improving in the ED.  Suspect possible viral illness versus gastritis versus GERD.  Given absence of any acute emergent pathology, will treat symptomatically and provide with work note.     Duffy Bruce, MD 02/18/20 [phone]

## 2020-02-18 NOTE — ED Notes (Signed)
Patient discharged home, patient received discharge papers and prescription. Patient appropriate and cooperative,  Vital signs taken. NAD noted.

## 2020-02-18 NOTE — ED Triage Notes (Signed)
Pt presents to ED with generalized throbbing abd pain and vomiting (X10) since Tuesday evening. Pt alert and talkative at this time with no acute distress noted.

## 2020-02-18 NOTE — ED Provider Notes (Signed)
[facility] Emergency Department Provider Note  Time seen: 6:27 AM  I have reviewed the triage vital signs and the nursing notes.   HISTORY  Chief Complaint Abdominal Pain   HPI Tamara Harrison is a 24 y.o. female with hypothyroidism presents to the emergency department for abdominal pain.  According to the patient since earlier today  she has been experiencing generalized abdominal pain nausea and vomiting.  Patient denies any diarrhea.  Denies any fever.  No known sick contacts.  Patient states 10/10 mid aching abdominal pain currently.  Past Medical History:  Diagnosis Date  . Thyroid disease     Patient Active Problem List   Diagnosis Date Noted  . Labor and delivery indication for care or intervention 06/26/2017  . Pregnancy complicated by obesity [id number]  . Uterine fibroids affecting pregnancy 05/07/2017  . Proteinuria affecting pregnancy 05/07/2017  . Hypothyroid in pregnancy, antepartum 05/07/2017    History reviewed. No pertinent surgical history.  Prior to Admission medications   Medication Sig Start Date End Date Taking? Authorizing Provider  ibuprofen (ADVIL,MOTRIN) 800 MG tablet Take 1 tablet (800 mg total) by mouth every 8 (eight) hours as needed for moderate pain or cramping. 07/04/17   Benjaman Kindler, MD  levothyroxine (SYNTHROID) 50 MCG tablet Take 1 tablet (50 mcg total) by mouth daily. Patient taking differently: Take 25 mcg by mouth daily.  05/07/17 05/07/18  Dellia Nims, MD  ondansetron (ZOFRAN ODT) 4 MG disintegrating tablet Take 1 tablet (4 mg total) by mouth every 6 (six) hours as needed for nausea or vomiting. 02/24/18   Delman Kitten, MD  ondansetron (ZOFRAN ODT) 4 MG disintegrating tablet Take 1 tablet (4 mg total) by mouth every 8 (eight) hours as needed for nausea or vomiting. 03/01/18   Gregor Hams, MD    No Known Allergies  Family History  Problem Relation Age of Onset  . Diabetes Father     Social History Social  History   Tobacco Use  . Smoking status: Never Smoker  . Smokeless tobacco: Never Used  Substance Use Topics  . Alcohol use: No  . Drug use: No    Comment: positive cocaine during pregnancy    Review of Systems Constitutional: Negative for fever. Cardiovascular: Negative for chest pain. Respiratory: Negative for shortness of breath. Gastrointestinal: Abdominal pain positive for nausea vomiting. Genitourinary: Negative for urinary compaints Musculoskeletal: Negative for musculoskeletal complaints Neurological: Negative for headache All other ROS negative  ____________________________________________   PHYSICAL EXAM:  VITAL SIGNS: ED Triage Vitals  Enc Vitals Group     BP 02/18/20 0350 131/68     Pulse Rate 02/18/20 0350 95     Resp 02/18/20 0350 16     Temp 02/18/20 0350 99.4 F (37.4 C)     Temp Source 02/18/20 0350 Oral     SpO2 02/18/20 0350 99 %     Weight 02/18/20 0411 267 lb (121.1 kg)     Height 02/18/20 0411 5\' 6"  (1.676 m)     Head Circumference --      Peak Flow --      Pain Score 02/18/20 0411 10     Pain Loc --      Pain Edu? --      Excl. in [place]? --    Constitutional: Alert and oriented. Well appearing and in no distress. Eyes: Normal exam ENT      Head: Normocephalic and atraumatic.      Mouth/Throat: Mucous membranes are moist. Cardiovascular:  Normal rate, regular rhythm. Respiratory: Normal respiratory effort without tachypnea nor retractions. Breath sounds are clear Gastrointestinal: Soft, mild mid abdominal tenderness palpation mostly in the periumbilical area. Musculoskeletal: Nontender with normal range of motion in all extremities. Neurologic:  Normal speech and language. No gross focal neurologic deficits Skin:  Skin is warm, dry and intact.  Psychiatric: Mood and affect are normal.   ____________________________________________     RADIOLOGY  CT scan negative  ____________________________________________   INITIAL IMPRESSION /  ASSESSMENT AND PLAN / ED COURSE  Pertinent labs & imaging results that were available during my care of the patient were reviewed by me and considered in my medical decision making (see chart for details).   Patient presents emergency department for abdominal pain nausea vomiting.  States started yesterday, states 10/10 pain currently mostly in the periumbilical area.  Given the patient's nausea vomiting.  For local pain we will obtain labs, CT imaging.  We will treat pain nausea and IV hydrate while awaiting results.  We will also obtain a Covid swab as a precaution  Overall reassuring work-up.Tamara Harrison was evaluated in Emergency Department on 02/18/2020 for the symptoms described in the history of present illness. She was evaluated in the context of the global COVID-19 pandemic, which necessitated consideration that the patient might be at risk for infection with the SARS-CoV-2 virus that causes COVID-19. Institutional protocols and algorithms that pertain to the evaluation of patients at risk for COVID-19 are in a state of rapid change based on information released by regulatory bodies including the CDC and federal and state organizations. These policies and algorithms were followed during the patient's care in the ED.  ____________________________________________   FINAL CLINICAL IMPRESSION(S) / ED DIAGNOSES  Abdominal pain Nausea vomiting   Harvest Dark, MD 02/19/20 [phone]

## 2020-05-11 DIAGNOSIS — O0993 Supervision of high risk pregnancy, unspecified, third trimester: Secondary | ICD-10-CM | POA: Insufficient documentation

## 2020-05-12 ENCOUNTER — Other Ambulatory Visit: Payer: Self-pay

## 2020-05-12 ENCOUNTER — Encounter: Payer: Self-pay | Admitting: Gastroenterology

## 2020-05-12 ENCOUNTER — Ambulatory Visit (INDEPENDENT_AMBULATORY_CARE_PROVIDER_SITE_OTHER): Payer: Medicaid Other | Admitting: Gastroenterology

## 2020-05-12 VITALS — BP 110/73 | HR 80 | Temp 98.2°F | Ht 66.0 in | Wt 287.0 lb

## 2020-05-12 DIAGNOSIS — R1031 Right lower quadrant pain: Secondary | ICD-10-CM | POA: Diagnosis not present

## 2020-05-12 DIAGNOSIS — R197 Diarrhea, unspecified: Secondary | ICD-10-CM

## 2020-05-12 NOTE — Patient Instructions (Signed)
Por favor tomar Metamucil diario para ayudarle con estrenimiento.   Psyllium granules or powder for solution Qu es este medicamento? El PLNTAGO PSYLLIUM es un laxante de fibra que forma masa. Este medicamento se [place] para Recruitment consultant. Aumentar la [employer] en la dieta tambin podra ayudar a disminuir el colesterol y fomentar la salud cardiaca para Psychologist, clinical. Este medicamento puede ser utilizado para otros usos; si tiene alguna pregunta consulte con su proveedor de atencin mdica o con su farmacutico. MARCAS COMUNES: Fiber Therapy, GenFiber, Geri-Mucil, Hydrocil, Konsyl, Metamucil, Metamucil [facility], Mucilin, Natural Fiber Therapy, Reguloid Qu le debo informar a mi profesional de la salud antes de tomar este medicamento? Necesitan saber si usted presenta alguno de los siguientes problemas o situaciones: obstruccin intestinal dificultad para tragar enfermedad intestinal inflamatoria fenilcetonuria problemas estomacales o intestinales cambio repentino en los hbitos intestinales que dura ms de 2 semanas una reaccin alrgica o inusual al Plntago psyllium, a otros medicamentos, colorantes o conservantes si est embarazada o buscando quedar embarazada si est amamantando a un beb Cmo debo utilizar este medicamento? Mezcle este medicamento con un vaso lleno (240 ml) de agua u otra bebida fra. Tome este medicamento por va oral. Siga las instrucciones de la etiqueta del envase o use segn las instrucciones de su profesional de la salud. Use su medicamento a intervalos regulares. No use su medicamento con una frecuencia mayor a la indicada. Hable con su pediatra para informarse acerca del uso de este medicamento en nios. Aunque este medicamento se puede recetar a nios tan pequeos como de 6 aos de edad en casos selectos, existen precauciones que deben tomarse. Sobredosis: Pngase en contacto inmediatamente con un centro toxicolgico o una sala de urgencia si usted cree que  haya tomado demasiado medicamento. ATENCIN: [employer] es solo para usted. No comparta este medicamento con nadie. Qu sucede si me olvido de una dosis? Si olvida una dosis, tmela lo antes posible. Si es casi la hora de la prxima dosis, tome slo esa dosis. No tome dosis adicionales o dobles. Qu puede interactuar con este medicamento? No se anticipan interacciones. Use este producto al menos 2 horas antes o despus de otros medicamentos. Puede ser que esta lista no menciona todas las posibles interacciones. Informe a su profesional de [employer]	[place] de [employer] productos a base de hierbas, medicamentos de [place] o suplementos nutritivos que est tomando. Si usted fuma, consume bebidas alcohlicas o si utiliza drogas ilegales, indqueselo tambin a su profesional de [employer]	[place]. Algunas sustancias pueden interactuar con su medicamento. A qu debo estar atento al usar Coca[employer]Cola? Hable con su mdico o con su profesional de la salud si sus sntomas no comienzan a mejorar o si empeoran. Deje de usar Coca[employer]Cola y contacte a su mdico o a su profesional de la salud si tiene sangrado rectal o si tiene que tratar su estreimiento por ms de 1 semana. Estos pueden ser signos de una afeccin mdica ms grave. Beba varios vasos de Chartered loss adjuster por da mientras [place] este medicamento. Esto ayudar a Theatre stage manager estreimiento y Environmental manager. Qu efectos secundarios puedo tener al [employer] este medicamento? Efectos secundarios que debe informar a su mdico o a Barrister's clerk de la salud tan pronto como sea posible: Chief of Staff, como erupcin cutnea, comezn/picazn o urticaria, e hinchazn de la cara, los labios o la lengua problemas respiratorios dolor en el pecho nuseas, vmito sangrado del recto problemas para tragar Efectos secundarios que generalmente no requieren atencin mdica (infrmelos a  su mdico o a su profesional de la salud si persisten o si son  molestos): distensin gases calambres estomacales Puede ser que esta lista no menciona todos los posibles efectos secundarios. Comunquese a su mdico por asesoramiento mdico [employer]. Usted puede informar los efectos secundarios a la FDA por telfono al 1-800-FDA-1088. Dnde debo guardar mi medicina? Mantngala fuera del alcance de los nios. Gurdela a [employer], entre 15 y 53 grados C (43 y 24 grados F). Protjala de la humedad. Deseche todo el medicamento que no haya utilizado, despus de la fecha de vencimiento. ATENCIN: Este folleto es un resumen. Puede ser que no cubra toda la posible informacin. Si usted tiene preguntas acerca de esta medicina, consulte con su mdico, su farmacutico o su profesional de Technical sales engineer.  2020 Elsevier/Gold Standard (2018-04-30 00:00:00)

## 2020-05-13 NOTE — Progress Notes (Signed)
Tamara Harrison [address]  [place]  [place], [place] [address]  Main: [phone]  Fax: [phone]   Gastroenterology Consultation  Referring Provider:     Center, [place] Physician:  Center, [facility] Reason for Consultation:     Abdominal pain        HPI:    Chief Complaint  Patient presents with  . New Patient (Initial Visit)  . Abdominal Pain    Tamara Harrison is a 24 y.o. y/o female referred for consultation & management  by Dr. Domingo Madeira, [place].  Patient reports 22-month history of intermittent left lower quadrant abdominal pain.  5/10, nonradiating.  Does not worsen with meals.  Laying down may help.  States every time she eats she also has to run to the bathroom.  Sometimes the pain does get better after a bowel movement.  No blood in stool.  No nausea or vomiting or weight loss.  No dysphagia.  Patient had a recent CT in May 2021 for abdominal pain that did not show any acute findings.  Hepatic steatosis and possible cholelithiasis was reported.  Right upper quadrant ultrasound showed normal gallbladder.  Past Medical History:  Diagnosis Date  . Thyroid disease     History reviewed. No pertinent surgical history.  Prior to Admission medications   Medication Sig Start Date End Date Taking? Authorizing Provider  ondansetron (ZOFRAN ODT) 4 MG disintegrating tablet Take 1 tablet (4 mg total) by mouth every 8 (eight) hours as needed for nausea or vomiting. Patient not taking: Reported on 05/12/2020 02/18/20   Duffy Bruce, MD    Family History  Problem Relation Age of Onset  . Diabetes Father      Social History   Tobacco Use  . Smoking status: Never Smoker  . Smokeless tobacco: Never Used  Substance Use Topics  . Alcohol use: No  . Drug use: No    Comment: positive cocaine during pregnancy    Allergies as of 05/12/2020  . (No Known Allergies)    Review of Systems:    All  systems reviewed and negative except where noted in HPI.   Physical Exam:  BP 110/73   Pulse 80   Temp 98.2 F (36.8 C) (Oral)   Ht 5\' 6"  (1.676 m)   Wt 287 lb (130.2 kg)   BMI 46.32 kg/m  No LMP recorded. Patient has had an implant. Psych:  Alert and cooperative. Normal mood and affect. General:   Alert,  Well-developed, well-nourished, pleasant and cooperative in NAD Head:  Normocephalic and atraumatic. Eyes:  Sclera clear, no icterus.   Conjunctiva pink. Ears:  Normal auditory acuity. Nose:  No deformity, discharge, or lesions. Mouth:  No deformity or lesions,oropharynx pink & moist. Neck:  Supple; no masses or thyromegaly. Abdomen:  Normal bowel sounds.  No bruits.  Soft, non-tender and non-distended without masses, hepatosplenomegaly or hernias noted.  No guarding or rebound tenderness.    Msk:  Symmetrical without gross deformities. Good, equal movement & strength bilaterally. Pulses:  Normal pulses noted. Extremities:  No clubbing or edema.  No cyanosis. Neurologic:  Alert and oriented x3;  grossly normal neurologically. Skin:  Intact without significant lesions or rashes. No jaundice. Lymph Nodes:  No significant cervical adenopathy. Psych:  Alert and cooperative. Normal mood and affect.   Labs: CBC    Component Value Date/Time   WBC 12.4 (H) 02/18/2020 0355   RBC 4.94 02/18/2020 0355  HGB 14.1 02/18/2020 0355   HGB 13.9 04/18/2012 1328   HCT 42.5 02/18/2020 0355   HCT 42.1 04/18/2012 1328   PLT 441 (H) 02/18/2020 0355   PLT 376 04/18/2012 1328   MCV 86.0 02/18/2020 0355   MCV 87 04/18/2012 1328   MCH 28.5 02/18/2020 0355   MCHC 33.2 02/18/2020 0355   RDW 12.7 02/18/2020 0355   RDW 13.2 04/18/2012 1328   LYMPHSABS 0.8 02/18/2020 0355   MONOABS 0.4 02/18/2020 0355   EOSABS 0.1 02/18/2020 0355   BASOSABS 0.0 02/18/2020 0355   CMP     Component Value Date/Time   NA 138 02/18/2020 0355   NA 142 (H) 04/18/2012 1328   K 4.0 02/18/2020 0355   K 3.9  04/18/2012 1328   CL 106 02/18/2020 0355   CL 106 04/18/2012 1328   CO2 26 02/18/2020 0355   CO2 30 (H) 04/18/2012 1328   GLUCOSE 109 (H) 02/18/2020 0355   GLUCOSE 82 04/18/2012 1328   BUN 12 02/18/2020 0355   BUN 10 04/18/2012 1328   CREATININE 0.70 02/18/2020 0355   CREATININE 0.53 (L) 04/18/2012 1328   CALCIUM 8.5 (L) 02/18/2020 0355   CALCIUM 8.8 (L) 04/18/2012 1328   PROT 7.7 02/18/2020 0355   PROT 7.6 04/18/2012 1328   ALBUMIN 4.0 02/18/2020 0355   ALBUMIN 3.6 (L) 04/18/2012 1328   AST 22 02/18/2020 0355   AST 18 04/18/2012 1328   ALT 24 02/18/2020 0355   ALT 21 04/18/2012 1328   ALKPHOS 86 02/18/2020 0355   ALKPHOS 120 04/18/2012 1328   BILITOT 1.0 02/18/2020 0355   BILITOT 0.2 04/18/2012 1328   GFRNONAA >60 02/18/2020 0355   GFRAA >60 02/18/2020 0355    Imaging Studies: No results found.  Assessment and Plan:   [name] is a 24 y.o. y/o female has been referred for abdominal pain  Pain is in the right lower quadrant, and therefore it is not due to gallbladder etiology  Patient is also reporting frequent bowel movements  Obtain fecal calprotectin and pancreatic elastase  Unlikely to be infectious given the duration it has been going on for  Start Metamucil to help bulk stool  Further testing as per above results  Discuss fatty liver work-up on next visit  Dr Tamara Harrison  Speech recognition software was used to dictate the above note.

## 2020-07-29 ENCOUNTER — Telehealth: Payer: Self-pay | Admitting: Gastroenterology

## 2020-07-29 ENCOUNTER — Telehealth: Payer: Medicaid Other | Admitting: Gastroenterology

## 2020-09-15 NOTE — Telephone Encounter (Signed)
open encounter in error 

## 2021-03-02 ENCOUNTER — Other Ambulatory Visit: Payer: Self-pay

## 2021-03-02 ENCOUNTER — Emergency Department: Payer: Medicaid Other

## 2021-03-02 ENCOUNTER — Emergency Department
Admission: EM | Admit: 2021-03-02 | Discharge: 2021-03-02 | Disposition: A | Discharge status: Home / Self Care | Payer: Medicaid Other | Attending: Emergency Medicine | Admitting: Emergency Medicine

## 2021-03-02 ENCOUNTER — Encounter: Payer: Self-pay | Admitting: Emergency Medicine

## 2021-03-02 DIAGNOSIS — R197 Diarrhea, unspecified: Secondary | ICD-10-CM | POA: Insufficient documentation

## 2021-03-02 DIAGNOSIS — Z3A01 Less than 8 weeks gestation of pregnancy: Secondary | ICD-10-CM | POA: Insufficient documentation

## 2021-03-02 DIAGNOSIS — Z20822 Contact with and (suspected) exposure to covid-19: Secondary | ICD-10-CM | POA: Insufficient documentation

## 2021-03-02 DIAGNOSIS — R1084 Generalized abdominal pain: Secondary | ICD-10-CM | POA: Insufficient documentation

## 2021-03-02 DIAGNOSIS — O26891 Other specified pregnancy related conditions, first trimester: Secondary | ICD-10-CM | POA: Insufficient documentation

## 2021-03-02 DIAGNOSIS — O219 Vomiting of pregnancy, unspecified: Secondary | ICD-10-CM | POA: Diagnosis not present

## 2021-03-02 DIAGNOSIS — R1030 Lower abdominal pain, unspecified: Secondary | ICD-10-CM

## 2021-03-02 DIAGNOSIS — Z3491 Encounter for supervision of normal pregnancy, unspecified, first trimester: Secondary | ICD-10-CM

## 2021-03-02 LAB — CBC
HCT: 39.2 % (ref 36.0–46.0)
Hemoglobin: 13.4 g/dL (ref 12.0–15.0)
MCH: 28.8 pg (ref 26.0–34.0)
MCHC: 34.2 g/dL (ref 30.0–36.0)
MCV: 84.1 fL (ref 80.0–100.0)
Platelets: 399 10*3/uL (ref 150–400)
RBC: 4.66 MIL/uL (ref 3.87–5.11)
RDW: 13.4 % (ref 11.5–15.5)
WBC: 13.3 10*3/uL — ABNORMAL HIGH (ref 4.0–10.5)
nRBC: 0 % (ref 0.0–0.2)

## 2021-03-02 LAB — URINALYSIS, COMPLETE (UACMP) WITH MICROSCOPIC
Bilirubin Urine: NEGATIVE
Glucose, UA: NEGATIVE mg/dL
Hgb urine dipstick: NEGATIVE
Ketones, ur: NEGATIVE mg/dL
Nitrite: NEGATIVE
Protein, ur: NEGATIVE mg/dL
Specific Gravity, Urine: 1.029 (ref 1.005–1.030)
pH: 5 (ref 5.0–8.0)

## 2021-03-02 LAB — COMPREHENSIVE METABOLIC PANEL
ALT: 33 U/L (ref 0–44)
AST: 31 U/L (ref 15–41)
Albumin: 3.4 g/dL — ABNORMAL LOW (ref 3.5–5.0)
Alkaline Phosphatase: 97 U/L (ref 38–126)
Anion gap: 8 (ref 5–15)
BUN: 8 mg/dL (ref 6–20)
CO2: 23 mmol/L (ref 22–32)
Calcium: 8.8 mg/dL — ABNORMAL LOW (ref 8.9–10.3)
Chloride: 105 mmol/L (ref 98–111)
Creatinine, Ser: 0.65 mg/dL (ref 0.44–1.00)
GFR, Estimated: >60 mL/min (ref >60)
Glucose, Bld: 103 mg/dL — ABNORMAL HIGH (ref 70–99)
Potassium: 3.4 mmol/L — ABNORMAL LOW (ref 3.5–5.1)
Sodium: 136 mmol/L (ref 135–145)
Total Bilirubin: 0.4 mg/dL (ref 0.3–1.2)
Total Protein: 7.1 g/dL (ref 6.5–8.1)

## 2021-03-02 LAB — LIPASE, BLOOD: Lipase: 24 U/L (ref 11–51)

## 2021-03-02 LAB — HCG, QUANTITATIVE, PREGNANCY: hCG, Beta Chain, Quant, S: 1795 m[IU]/mL — ABNORMAL HIGH (ref <5)

## 2021-03-02 LAB — SARS CORONAVIRUS 2 (TAT 6-24 HRS): SARS Coronavirus 2: NEGATIVE

## 2021-03-02 MED ORDER — CEPHALEXIN 500 MG PO CAPS: 500.0000 mg | ORAL_CAPSULE | Freq: Three times a day (TID) | ORAL | 0 refills | Status: AC

## 2021-03-02 MED ORDER — DOXYLAMINE-PYRIDOXINE 10-10 MG PO TBEC
DELAYED_RELEASE_TABLET | ORAL | 0 refills | Status: DC
Start: 2021-03-02 — End: 2021-10-26

## 2021-03-02 MED ORDER — LACTATED RINGERS IV BOLUS
1000.0000 mL | Freq: Once | INTRAVENOUS | Status: AC
  Administered 2021-03-02: 1000 mL via INTRAVENOUS

## 2021-03-02 MED ORDER — ONDANSETRON HCL 4 MG/2ML IJ SOLN
4.0000 mg | Freq: Once | INTRAMUSCULAR | Status: AC
  Administered 2021-03-02: 4 mg via INTRAVENOUS
  Filled 2021-03-02: qty 2

## 2021-03-02 MED ORDER — ONDANSETRON 4 MG PO TBDP
4.0000 mg | ORAL_TABLET | Freq: Three times a day (TID) | ORAL | 0 refills | Status: DC | PRN
Start: 2021-03-02 — End: 2022-03-14

## 2021-03-02 NOTE — ED Notes (Signed)
Pt tolerated fluid challenge. 

## 2021-03-02 NOTE — ED Provider Notes (Signed)
[facility] Emergency Department Provider Note  ____________________________________________   Event Date/Time   First MD Initiated Contact with Patient 03/02/21 [phone]     (approximate)  I have reviewed the triage vital signs and the nursing notes.   HISTORY  Chief Complaint Abdominal Pain and Emesis During Pregnancy    HPI Tamara Harrison is a 25 y.o. female currently at an estimated 4 weeks pregnancy here with nausea and vomiting and diarrhea.  The patient states that her symptoms started approximately 2 to 3 days ago with nausea, vomiting, and general, primarily epigastric abdominal pain.  She describes it as a cramp-like pain associated with vomiting of nonbloody, nonbilious emesis.  She has had persistent nausea and emesis since then, worse in the mornings.  Of note, she is also developed loose stools in the last 24 hours, which is new.  No known recent sick contacts or suspicious food intakes.  No known COVID exposures.  This is her second pregnancy and she did have some nausea with her first pregnancy, though not as severe.  Denies any urinary symptoms.  No vaginal bleeding or discharge.  No other complaints.        Past Medical History:  Diagnosis Date  . Thyroid disease     Patient Active Problem List   Diagnosis Date Noted  . Morbid obesity ([place]) 05/11/2020  . Supervision of high risk pregnancy in third trimester 05/11/2020  . Labor and delivery indication for care or intervention 06/26/2017  . Pregnancy complicated by obesity 48/18/5631  . Uterine fibroids affecting pregnancy 05/07/2017  . Proteinuria affecting pregnancy 05/07/2017  . Hypothyroid in pregnancy, antepartum 05/07/2017    History reviewed. No pertinent surgical history.  Prior to Admission medications   Medication Sig Start Date End Date Taking? Authorizing Provider  cephALEXin (KEFLEX) 500 MG capsule Take 1 capsule (500 mg total) by mouth 3 (three) times daily for 5 days.  03/02/21 03/07/21 Yes Duffy Bruce, MD  Doxylamine-Pyridoxine (DICLEGIS) 10-10 MG TBEC Take two tablets at bedtime on day 1 and 2; if symptoms persist, take 1 tab in AM and 2 tabs at bedtime day 3, then 1 tab every 6 hr PRN 03/02/21  Yes Duffy Bruce, MD  ondansetron (ZOFRAN ODT) 4 MG disintegrating tablet Take 1 tablet (4 mg total) by mouth every 8 (eight) hours as needed for refractory nausea / vomiting. 03/02/21  Yes Duffy Bruce, MD    Allergies Patient has no known allergies.  Family History  Problem Relation Age of Onset  . Diabetes Father     Social History Social History   Tobacco Use  . Smoking status: Never Smoker  . Smokeless tobacco: Never Used  Substance Use Topics  . Alcohol use: No  . Drug use: No    Comment: positive cocaine during pregnancy    Review of Systems  Review of Systems  Constitutional: Positive for fatigue. Negative for fever.  HENT: Negative for congestion and sore throat.   Eyes: Negative for visual disturbance.  Respiratory: Negative for cough and shortness of breath.   Cardiovascular: Negative for chest pain.  Gastrointestinal: Positive for abdominal pain, nausea and vomiting. Negative for diarrhea.  Genitourinary: Negative for flank pain.  Musculoskeletal: Negative for back pain and neck pain.  Skin: Negative for rash and wound.  Neurological: Negative for weakness.  All other systems reviewed and are negative.    ____________________________________________  PHYSICAL EXAM:      VITAL SIGNS: ED Triage Vitals  Enc Vitals Group  BP 03/02/21 0223 136/83     Pulse Rate 03/02/21 0223 (!) 102     Resp 03/02/21 0223 18     Temp 03/02/21 0223 98.7 F (37.1 C)     Temp Source 03/02/21 0223 Oral     SpO2 03/02/21 0223 99 %     Weight 03/02/21 0223 245 lb (111.1 kg)     Height 03/02/21 0223 5\' 6"  (1.676 m)     Head Circumference --      Peak Flow --      Pain Score 03/02/21 0222 10     Pain Loc --      Pain Edu? --      Excl. in  [place]? --      Physical Exam Vitals and nursing note reviewed.  Constitutional:      General: She is not in acute distress.    Appearance: She is well-developed.  HENT:     Head: Normocephalic and atraumatic.  Eyes:     Conjunctiva/sclera: Conjunctivae normal.  Cardiovascular:     Rate and Rhythm: Normal rate and regular rhythm.     Heart sounds: Normal heart sounds. No murmur heard. No friction rub.  Pulmonary:     Effort: Pulmonary effort is normal. No respiratory distress.     Breath sounds: Normal breath sounds. No wheezing or rales.  Abdominal:     General: There is no distension.     Palpations: Abdomen is soft.     Tenderness: There is abdominal tenderness (Minimal) in the epigastric area.  Musculoskeletal:     Cervical back: Neck supple.  Skin:    General: Skin is warm.     Capillary Refill: Capillary refill takes less than 2 seconds.  Neurological:     Mental Status: She is alert and oriented to person, place, and time.     Motor: No abnormal muscle tone.       ____________________________________________   LABS (all labs ordered are listed, but only abnormal results are displayed)  Labs Reviewed  COMPREHENSIVE METABOLIC PANEL - Abnormal; Notable for the following components:      Result Value   Potassium 3.4 (*)    Glucose, Bld 103 (*)    Calcium 8.8 (*)    Albumin 3.4 (*)    All other components within normal limits  CBC - Abnormal; Notable for the following components:   WBC 13.3 (*)    All other components within normal limits  URINALYSIS, COMPLETE (UACMP) WITH MICROSCOPIC - Abnormal; Notable for the following components:   Color, Urine YELLOW (*)    APPearance HAZY (*)    Leukocytes,Ua TRACE (*)    Bacteria, UA FEW (*)    All other components within normal limits  HCG, QUANTITATIVE, PREGNANCY - Abnormal; Notable for the following components:   hCG, Beta Chain, Quant, S 1,795 (*)    All other components within normal limits  SARS CORONAVIRUS 2  (TAT 6-24 HRS)  LIPASE, BLOOD    ____________________________________________  EKG:  ________________________________________  RADIOLOGY All imaging, including plain films, CT scans, and ultrasounds, independently reviewed by me, and interpretations confirmed via formal radiology reads.  ED MD interpretation:   Ultrasound OB: Intrauterine gestational sac corresponding to 5 weeks 2-day, probably early gestational sac but no yolk sac or fetal pole identified, recommend serial hCGs  Official radiology report(s): US OB LESS THAN 14 WEEKS WITH OB TRANSVAGINAL  Result Date: 03/02/2021 CLINICAL DATA:  Lower abdominal pain. EXAM: OBSTETRIC <14 WK [place] AND TRANSVAGINAL OB  US TECHNIQUE: Both transabdominal and transvaginal ultrasound examinations were performed for complete evaluation of the gestation as well as the maternal uterus, adnexal regions, and pelvic cul-de-sac. Transvaginal technique was performed to assess early pregnancy. COMPARISON:  CT 02/24/2018. FINDINGS: Intrauterine gestational sac: Single Yolk sac:  Not visualized Embryo:  Not visualized Cardiac Activity: Not visualized MSD: 4.7 mm   5 w   2 d Subchorionic hemorrhage:  None visualized. Maternal uterus/adnexae: A 2.9 cm and a 1.2 cm uterine fibroid noted. A 2.3 cm probable corpus luteal cyst right ovary. A 2.0 cm simple cyst left ovary. No free fluid. IMPRESSION: 1. Intrauterine gestational sac noted with mean sac diameter 4.7 mm corresponding to 5 weeks 2 days. Probable early intrauterine gestational sac, but no yolk sac, fetal pole, or cardiac activity yet visualized. Recommend follow-up quantitative B-HCG levels and follow-up US in 14 days to assess viability. This recommendation follows SRU consensus guidelines: Diagnostic Criteria for Nonviable Pregnancy Early in the First Trimester. [place] Med 2013; 016:0109-32. 2.  Fibroid uterus. 3. 2.3 cm probable corpus luteal cyst right ovary. 2.0 cm simple cyst left ovary. Electronically Signed    By: Marcello Moores  Register   On: 03/02/2021 06:34    ____________________________________________  PROCEDURES   Procedure(s) performed (including Critical Care):  Procedures  ____________________________________________  INITIAL IMPRESSION / MDM / [place] / ED COURSE  As part of my medical decision making, I reviewed the following data within the [place] notes reviewed and incorporated, Old chart reviewed, Notes from prior ED visits, and [place] Controlled Substance [place] was evaluated in Emergency Department on 03/02/2021 for the symptoms described in the history of present illness. She was evaluated in the context of the global COVID-19 pandemic, which necessitated consideration that the patient might be at risk for infection with the SARS-CoV-2 virus that causes COVID-19. Institutional protocols and algorithms that pertain to the evaluation of patients at risk for COVID-19 are in a state of rapid change based on information released by regulatory bodies including the CDC and federal and state organizations. These policies and algorithms were followed during the patient's care in the ED.  Some ED evaluations and interventions may be delayed as a result of limited staffing during the pandemic.*     Medical Decision Making:  25 yo F here with n/v. Pt is currently estimated 4-[redacted] wk GA based on LMP. Suspect NV of pregnancy, with possible concomitant viral URI given diarrhea. Abdomen is soft, NT, ND, no focal TTP to suggest cholecystitis, appendicitis, or other surgical abnormality. UA shows +bacteria but no overt UTI - will tx given first trimester pregnancy. HCG 1800 and Vaginal u/S reviewed, shows likely IUP though no fetal pole identified. No signs of ectopic or other complications. Pt given fluids, antiemetics with improvement and is tolerating PO. Will d/c with antiemetics, ABX for bacteriuria, and outpt follow-up with OB for repeat  quant and imaging as indicated.  ____________________________________________  FINAL CLINICAL IMPRESSION(S) / ED DIAGNOSES  Final diagnoses:  Generalized abdominal pain  First trimester pregnancy     MEDICATIONS GIVEN DURING THIS VISIT:  Medications  lactated ringers bolus 1,000 mL (0 mLs Intravenous Stopped 03/02/21 0946)  ondansetron (ZOFRAN) injection 4 mg (4 mg Intravenous Given 03/02/21 0751)     ED Discharge Orders         Ordered    Doxylamine-Pyridoxine (DICLEGIS) 10-10 MG TBEC        03/02/21  0911    ondansetron (ZOFRAN ODT) 4 MG disintegrating tablet  Every 8 hours PRN        03/02/21 0911    cephALEXin (KEFLEX) 500 MG capsule  3 times daily        03/02/21 0911           Note:  This document was prepared using Dragon voice recognition software and may include unintentional dictation errors.   Duffy Bruce, MD 03/02/21 [phone]

## 2021-03-02 NOTE — Discharge Instructions (Addendum)
As we discussed, because you are so early, it is difficult to say whether the pregnancy is fully viable or not. It does seem to be in the correct location and I suspect your symptoms are from pregnancy as well as possible viral illness.  Drink plenty of fluids.  Take the meds as prescribed  Start taking an over-the-counter prenatal vitamin daily

## 2021-03-02 NOTE — ED Notes (Signed)
Pt c/o abd pain for over a week 2nd pregancy; n/v/ and diarrhea started today; umbilical and lower medial abd pain;

## 2021-03-02 NOTE — ED Triage Notes (Signed)
Pt states she is pregnant, unknown how far along she is, pt states she is having abd pain and N/V.   Pt states she took home preg test that was positive.   Pt is G2 P1

## 2021-04-23 LAB — OB RESULTS CONSOLE RUBELLA ANTIBODY, IGM: Rubella: IMMUNE

## 2021-04-23 LAB — OB RESULTS CONSOLE HEPATITIS B SURFACE ANTIGEN: Hepatitis B Surface Ag: NEGATIVE

## 2021-04-23 LAB — OB RESULTS CONSOLE VARICELLA ZOSTER ANTIBODY, IGG: Varicella: NON-IMMUNE/NOT IMMUNE

## 2021-04-30 ENCOUNTER — Encounter: Payer: Self-pay | Admitting: Emergency Medicine

## 2021-04-30 ENCOUNTER — Other Ambulatory Visit: Payer: Self-pay

## 2021-04-30 ENCOUNTER — Emergency Department
Admission: EM | Admit: 2021-04-30 | Discharge: 2021-04-30 | Disposition: A | Discharge status: Home / Self Care | Payer: Medicaid Other | Attending: Emergency Medicine | Admitting: Emergency Medicine

## 2021-04-30 DIAGNOSIS — H60391 Other infective otitis externa, right ear: Secondary | ICD-10-CM

## 2021-04-30 DIAGNOSIS — H9201 Otalgia, right ear: Secondary | ICD-10-CM | POA: Diagnosis present

## 2021-04-30 MED ORDER — CIPROFLOXACIN-DEXAMETHASONE 0.3-0.1 % OT SUSP
4.0000 [drp] | Freq: Once | OTIC | Status: AC
  Administered 2021-04-30: 4 [drp] via OTIC
  Filled 2021-04-30: qty 7.5

## 2021-04-30 MED ORDER — CIPROFLOXACIN-DEXAMETHASONE 0.3-0.1 % OT SUSP: 4.0000 [drp] | Freq: Two times a day (BID) | OTIC | 0 refills | Status: AC

## 2021-04-30 NOTE — ED Triage Notes (Signed)
Pt c/o R ear pain since Thursday, pt states sudden worsening of pain earlier today and swelling inside of R ear. Pt A&O x4, NAD noted at this time.

## 2021-04-30 NOTE — ED Provider Notes (Signed)
[facility] Emergency Department Provider Note ____________________________________________  Time seen: 2200  I have reviewed the triage vital signs and the nursing notes.  HISTORY  Chief Complaint  Otalgia   HPI Tamara Harrison is a 25 y.o. female presents to the ER today with complaint of right ear pain.  She reports this started 3 days ago.  She reports that initially started as a slight ache but is now throbbing.  She has noticed some drainage from the right ear and some decreased hearing.  She denies fever, chills, body aches, headache, runny nose, nasal congestion, sore throat, cough or shortness of breath.  She has not put anything in her ear.  She reports she is currently on Amoxicillin provided to her by her OB for a possible ear infection but it has not helped at all.  Past Medical History:  Diagnosis Date   Thyroid disease     Patient Active Problem List   Diagnosis Date Noted   Morbid obesity ([place]) 05/11/2020   Supervision of high risk pregnancy in third trimester 05/11/2020   Labor and delivery indication for care or intervention 06/26/2017   Pregnancy complicated by obesity [id number]   Uterine fibroids affecting pregnancy 05/07/2017   Proteinuria affecting pregnancy 05/07/2017   Hypothyroid in pregnancy, antepartum 05/07/2017    History reviewed. No pertinent surgical history.  Prior to Admission medications   Medication Sig Start Date End Date Taking? Authorizing Provider  ciprofloxacin-dexamethasone (CIPRODEX) OTIC suspension Place 4 drops into the right ear 2 (two) times daily for 7 days. 04/30/21 05/07/21 Yes Jameir Ake, Coralie Keens, NP  Doxylamine-Pyridoxine (DICLEGIS) 10-10 MG TBEC Take two tablets at bedtime on day 1 and 2; if symptoms persist, take 1 tab in AM and 2 tabs at bedtime day 3, then 1 tab every 6 hr PRN 03/02/21   Duffy Bruce, MD  ondansetron (ZOFRAN ODT) 4 MG disintegrating tablet Take 1 tablet (4 mg total) by mouth every 8 (eight)  hours as needed for refractory nausea / vomiting. 03/02/21   Duffy Bruce, MD    Allergies Patient has no known allergies.  Family History  Problem Relation Age of Onset   Diabetes Father     Social History Social History   Tobacco Use   Smoking status: Never   Smokeless tobacco: Never  Substance Use Topics   Alcohol use: No   Drug use: No    Comment: positive cocaine during pregnancy    Review of Systems  Constitutional: Negative for fever, chills or body aches. Eyes: Negative for eye pain, eye redness or discharge. ENT: Positive for right ear pain.  Negative for runny nose, nasal congestion or sore throat. Cardiovascular: Negative for chest pain or chest tightness. Respiratory: Negative for cough or shortness of breath. Skin: Negative for rash. Neurological: Negative for headaches or dizziness. ____________________________________________  PHYSICAL EXAM:  VITAL SIGNS: ED Triage Vitals  Enc Vitals Group     BP 04/30/21 2109 134/72     Pulse Rate 04/30/21 2109 85     Resp 04/30/21 2109 18     Temp 04/30/21 2109 98.3 F (36.8 C)     Temp Source 04/30/21 2109 Oral     SpO2 04/30/21 2109 99 %     Weight --      Height --      Head Circumference --      Peak Flow --      Pain Score 04/30/21 2004 10     Pain Loc --  Pain Edu? --      Excl. in [place]? --     Constitutional: Alert and oriented.  Obese, in no distress. Head: Normocephalic. Eyes: Normal extraocular movements Right Ear: Canal erythematous and swollen.  Pus noted in external ear canal.  Unable to visualize TM. Hematological/Lymphatic/Immunological: No cervical lymphadenopathy. Cardiovascular: Normal rate, regular rhythm.  Respiratory: Normal respiratory effort. No wheezes/rales/rhonchi. Neurologic:  Normal speech and language. No gross focal neurologic deficits are appreciated. Skin:  Skin is warm, dry and intact. No rash noted. ____________________________________________  INITIAL IMPRESSION  / ASSESSMENT AND PLAN / ED COURSE  Otalgia, Right:  DDx include otitis externa, otitis media, ETD Exam c/w otitis externa Ciprodex 4 drops right ear x1 in ER Rx for Ciprodex 4 drops twice daily x7 days Avoid getting water in your ear for the next week Return precautions discussed ____________________________________________  FINAL CLINICAL IMPRESSION(S) / ED DIAGNOSES  Final diagnoses:  Other infective acute otitis externa of right ear      Jearld Fenton, NP 04/30/21 2258    Lucrezia Starch, MD 04/30/21 [phone]

## 2021-04-30 NOTE — Discharge Instructions (Addendum)
You were seen today for right ear pain.  Your exam is consistent with an outer ear infection.  I am giving you eardrops to use twice daily for the next 7 days.  Please follow-up with your PCP if symptoms persist

## 2021-05-24 ENCOUNTER — Other Ambulatory Visit: Payer: Self-pay | Admitting: Family Medicine

## 2021-05-24 DIAGNOSIS — F32A Depression, unspecified: Secondary | ICD-10-CM

## 2021-05-24 DIAGNOSIS — E039 Hypothyroidism, unspecified: Secondary | ICD-10-CM

## 2021-05-24 DIAGNOSIS — O99212 Obesity complicating pregnancy, second trimester: Secondary | ICD-10-CM

## 2021-05-24 DIAGNOSIS — O99342 Other mental disorders complicating pregnancy, second trimester: Secondary | ICD-10-CM

## 2021-06-16 ENCOUNTER — Other Ambulatory Visit: Payer: Self-pay

## 2021-06-16 DIAGNOSIS — O99212 Obesity complicating pregnancy, second trimester: Secondary | ICD-10-CM

## 2021-06-16 DIAGNOSIS — E039 Hypothyroidism, unspecified: Secondary | ICD-10-CM

## 2021-06-21 ENCOUNTER — Ambulatory Visit (HOSPITAL_BASED_OUTPATIENT_CLINIC_OR_DEPARTMENT_OTHER): Payer: Medicaid Other | Admitting: Maternal & Fetal Medicine

## 2021-06-21 ENCOUNTER — Other Ambulatory Visit: Payer: Self-pay | Admitting: Family Medicine

## 2021-06-21 ENCOUNTER — Ambulatory Visit: Payer: Medicaid Other | Attending: Maternal & Fetal Medicine

## 2021-06-21 ENCOUNTER — Other Ambulatory Visit: Payer: Self-pay

## 2021-06-21 DIAGNOSIS — E038 Other specified hypothyroidism: Secondary | ICD-10-CM

## 2021-06-21 DIAGNOSIS — Z3A21 21 weeks gestation of pregnancy: Secondary | ICD-10-CM

## 2021-06-21 DIAGNOSIS — O99212 Obesity complicating pregnancy, second trimester: Secondary | ICD-10-CM

## 2021-06-21 DIAGNOSIS — F32A Depression, unspecified: Secondary | ICD-10-CM | POA: Diagnosis not present

## 2021-06-21 DIAGNOSIS — E039 Hypothyroidism, unspecified: Secondary | ICD-10-CM | POA: Insufficient documentation

## 2021-06-21 DIAGNOSIS — O99342 Other mental disorders complicating pregnancy, second trimester: Secondary | ICD-10-CM | POA: Insufficient documentation

## 2021-06-21 DIAGNOSIS — O99282 Endocrine, nutritional and metabolic diseases complicating pregnancy, second trimester: Secondary | ICD-10-CM

## 2021-06-21 DIAGNOSIS — O09891 Supervision of other high risk pregnancies, first trimester: Secondary | ICD-10-CM

## 2021-06-21 DIAGNOSIS — Z363 Encounter for antenatal screening for malformations: Secondary | ICD-10-CM | POA: Diagnosis not present

## 2021-06-21 NOTE — Progress Notes (Signed)
MFM Consultation  Tamara Harrison is a 25 yo G2P1 who is here at [redacted]w[redacted]d with an EDD of 10/30/21 at the request of Dr. Clide Deutscher.  She is overall doing well without complaints or s/sx of preeclampsia or preterm labor.   Her prenatal care has been uneventful with exception of early paroxetine exposure. She has since switched to zoloft 50 mg and doing well.   Her prenatal labs, PIH and TSH labs have been normal. NIPS and 1hr GTT have not been performed yet. She is scheduled for a fetal echocardiogram at 20-22 weeks.  Vitals with BMI 06/21/2021 04/30/2021 03/02/2021  Height 5\' 5"  - -  Weight 290 lbs - -  BMI 52.77 - -  Systolic 824 235 361  Diastolic 74 72 65  Pulse 76 85 75   OB History  Gravida Para Term Preterm AB Living  2 1 1  0 0 1  SAB IAB Ectopic Multiple Live Births  0 0 0 0 1    # Outcome Date GA Lbr Len/2nd Weight Sex Delivery Anes PTL Lv  2 Current           1 Term 07/03/17 [redacted]w[redacted]d / 01:31 3440 g F Vag-Spont EPI  LIV     Name: Tamara Harrison     Apgar1: 7  Apgar5: 9   Family History  Problem Relation Age of Onset   Diabetes Father    Past Medical History:  Diagnosis Date   Thyroid disease    No past surgical history on file. Social History   Socioeconomic History   Marital status: Single    Spouse name: Not on file   Number of children: Not on file   Years of education: Not on file   Highest education level: Not on file  Occupational History   Not on file  Tobacco Use   Smoking status: Never   Smokeless tobacco: Never  Vaping Use   Vaping Use: Never used  Substance and Sexual Activity   Alcohol use: No   Drug use: No    Comment: positive cocaine during pregnancy   Sexual activity: Yes  Other Topics Concern   Not on file  Social History Narrative   Not on file   Social Determinants of Health   Financial Resource Strain: Not on file  Food Insecurity: Not on file  Transportation Needs: Not on file  Physical Activity: Not on file  Stress: Not on file   Social Connections: Not on file  Intimate Partner Violence: Not on file         Current Outpatient Medications (Other):    Doxylamine-Pyridoxine (DICLEGIS) 10-10 MG TBEC, Take two tablets at bedtime on day 1 and 2; if symptoms persist, take 1 tab in AM and 2 tabs at bedtime day 3, then 1 tab every 6 hr PRN (Patient not taking: Reported on 06/21/2021)   ondansetron (ZOFRAN ODT) 4 MG disintegrating tablet, Take 1 tablet (4 mg total) by mouth every 8 (eight) hours as needed for refractory nausea / vomiting.   Prenatal Vit-Fe Fumarate-FA (PRENATAL MULTIVITAMIN) TABS tablet, Take 1 tablet by mouth daily at 12 noon.   Imaging: Single intrauterine pregnancy here for a detailed anatomy due to elevated BMI.  Normal anatomy with measurements consistent with dates (EFW 38%). There is good fetal movement and amniotic fluid volume Suboptimal views of the fetal anatomy were obtained secondary to fetal position.  Impression/Counseling:  1) BMI >40  I discussed with Tamara Harrison that there is an increased risk for preeclampsia,  fetal growth restriction, fetal macrosmia, unidentified fetal malformation, cardiac defects, thrombosis, cesarean delivery and infection.  We recommend a 11-20 lb weight gain throughout pregnancy.   We also recommend consideration for daily low dose ASA to reduce the risk for preeclampsia.  2) History of paroxetine exposure.   We discussed that paroxetine exposure has inconsistently been associated with cardiovascular abnormalities. However, it is noted the benefit vs risk as it relates to maternal effects on maternal depression weigh toward the maternal benefit of taking the SSRI. She has since switched to Fluoxetine (Zoloft) and doing well. I am in agreement to obtain a fetal echocardiogram for maternal reassurance.   Recommendations: Daily low dose ASA Serial growth every 4-6 weeks Initiate weekly testing between 34-[redacted] weeks gestation.  I spent 30 minutes with >  50% in face to face consultation   Vikki Ports, MD.

## 2021-07-28 ENCOUNTER — Other Ambulatory Visit: Payer: Self-pay

## 2021-07-28 DIAGNOSIS — O09891 Supervision of other high risk pregnancies, first trimester: Secondary | ICD-10-CM

## 2021-07-28 DIAGNOSIS — E039 Hypothyroidism, unspecified: Secondary | ICD-10-CM

## 2021-07-28 DIAGNOSIS — O99280 Endocrine, nutritional and metabolic diseases complicating pregnancy, unspecified trimester: Secondary | ICD-10-CM

## 2021-08-02 ENCOUNTER — Ambulatory Visit: Payer: Medicaid Other | Attending: Obstetrics and Gynecology

## 2021-08-02 ENCOUNTER — Other Ambulatory Visit: Payer: Self-pay

## 2021-08-02 DIAGNOSIS — T43224A Poisoning by selective serotonin reuptake inhibitors, undetermined, initial encounter: Secondary | ICD-10-CM | POA: Insufficient documentation

## 2021-08-02 DIAGNOSIS — O09891 Supervision of other high risk pregnancies, first trimester: Secondary | ICD-10-CM | POA: Diagnosis not present

## 2021-08-02 DIAGNOSIS — O99212 Obesity complicating pregnancy, second trimester: Secondary | ICD-10-CM | POA: Insufficient documentation

## 2021-08-02 DIAGNOSIS — O99282 Endocrine, nutritional and metabolic diseases complicating pregnancy, second trimester: Secondary | ICD-10-CM | POA: Diagnosis not present

## 2021-08-02 DIAGNOSIS — E039 Hypothyroidism, unspecified: Secondary | ICD-10-CM

## 2021-08-02 DIAGNOSIS — O9A212 Injury, poisoning and certain other consequences of external causes complicating pregnancy, second trimester: Secondary | ICD-10-CM | POA: Insufficient documentation

## 2021-08-02 DIAGNOSIS — Z3A27 27 weeks gestation of pregnancy: Secondary | ICD-10-CM | POA: Insufficient documentation

## 2021-08-02 DIAGNOSIS — O99280 Endocrine, nutritional and metabolic diseases complicating pregnancy, unspecified trimester: Secondary | ICD-10-CM

## 2021-08-02 LAB — OB RESULTS CONSOLE HIV ANTIBODY (ROUTINE TESTING): HIV: NONREACTIVE

## 2021-08-12 LAB — OB RESULTS CONSOLE RPR: RPR: NONREACTIVE

## 2021-09-15 ENCOUNTER — Other Ambulatory Visit: Payer: Self-pay

## 2021-09-15 DIAGNOSIS — D259 Leiomyoma of uterus, unspecified: Secondary | ICD-10-CM

## 2021-09-15 DIAGNOSIS — O99212 Obesity complicating pregnancy, second trimester: Secondary | ICD-10-CM

## 2021-09-15 DIAGNOSIS — O09891 Supervision of other high risk pregnancies, first trimester: Secondary | ICD-10-CM

## 2021-09-15 DIAGNOSIS — O99280 Endocrine, nutritional and metabolic diseases complicating pregnancy, unspecified trimester: Secondary | ICD-10-CM

## 2021-09-15 DIAGNOSIS — O99213 Obesity complicating pregnancy, third trimester: Secondary | ICD-10-CM

## 2021-09-20 ENCOUNTER — Ambulatory Visit: Payer: Medicaid Other | Attending: Maternal & Fetal Medicine

## 2021-09-20 ENCOUNTER — Other Ambulatory Visit: Payer: Self-pay

## 2021-09-20 DIAGNOSIS — O09891 Supervision of other high risk pregnancies, first trimester: Secondary | ICD-10-CM

## 2021-09-20 DIAGNOSIS — Z3A34 34 weeks gestation of pregnancy: Secondary | ICD-10-CM | POA: Insufficient documentation

## 2021-09-20 DIAGNOSIS — D259 Leiomyoma of uterus, unspecified: Secondary | ICD-10-CM | POA: Diagnosis not present

## 2021-09-20 DIAGNOSIS — O99283 Endocrine, nutritional and metabolic diseases complicating pregnancy, third trimester: Secondary | ICD-10-CM | POA: Diagnosis not present

## 2021-09-20 DIAGNOSIS — O99213 Obesity complicating pregnancy, third trimester: Secondary | ICD-10-CM | POA: Diagnosis not present

## 2021-09-20 DIAGNOSIS — E039 Hypothyroidism, unspecified: Secondary | ICD-10-CM

## 2021-09-20 DIAGNOSIS — O3413 Maternal care for benign tumor of corpus uteri, third trimester: Secondary | ICD-10-CM | POA: Diagnosis not present

## 2021-09-20 DIAGNOSIS — O26893 Other specified pregnancy related conditions, third trimester: Secondary | ICD-10-CM | POA: Diagnosis not present

## 2021-09-26 ENCOUNTER — Other Ambulatory Visit: Payer: Self-pay

## 2021-09-26 DIAGNOSIS — O99213 Obesity complicating pregnancy, third trimester: Secondary | ICD-10-CM

## 2021-09-26 DIAGNOSIS — O09891 Supervision of other high risk pregnancies, first trimester: Secondary | ICD-10-CM

## 2021-09-26 DIAGNOSIS — E039 Hypothyroidism, unspecified: Secondary | ICD-10-CM

## 2021-09-26 DIAGNOSIS — D259 Leiomyoma of uterus, unspecified: Secondary | ICD-10-CM

## 2021-09-27 ENCOUNTER — Other Ambulatory Visit: Payer: Self-pay

## 2021-09-27 ENCOUNTER — Ambulatory Visit: Payer: Medicaid Other | Attending: Obstetrics

## 2021-09-27 DIAGNOSIS — O99213 Obesity complicating pregnancy, third trimester: Secondary | ICD-10-CM

## 2021-09-27 DIAGNOSIS — D259 Leiomyoma of uterus, unspecified: Secondary | ICD-10-CM | POA: Insufficient documentation

## 2021-09-27 DIAGNOSIS — O09893 Supervision of other high risk pregnancies, third trimester: Secondary | ICD-10-CM | POA: Diagnosis not present

## 2021-09-27 DIAGNOSIS — E039 Hypothyroidism, unspecified: Secondary | ICD-10-CM | POA: Insufficient documentation

## 2021-09-27 DIAGNOSIS — O3413 Maternal care for benign tumor of corpus uteri, third trimester: Secondary | ICD-10-CM | POA: Diagnosis not present

## 2021-09-27 DIAGNOSIS — Z3A35 35 weeks gestation of pregnancy: Secondary | ICD-10-CM | POA: Insufficient documentation

## 2021-09-27 DIAGNOSIS — O99280 Endocrine, nutritional and metabolic diseases complicating pregnancy, unspecified trimester: Secondary | ICD-10-CM

## 2021-09-27 DIAGNOSIS — O09891 Supervision of other high risk pregnancies, first trimester: Secondary | ICD-10-CM

## 2021-09-27 DIAGNOSIS — O99283 Endocrine, nutritional and metabolic diseases complicating pregnancy, third trimester: Secondary | ICD-10-CM | POA: Insufficient documentation

## 2021-09-29 ENCOUNTER — Other Ambulatory Visit: Payer: Self-pay

## 2021-09-29 DIAGNOSIS — O99280 Endocrine, nutritional and metabolic diseases complicating pregnancy, unspecified trimester: Secondary | ICD-10-CM

## 2021-09-29 DIAGNOSIS — O99213 Obesity complicating pregnancy, third trimester: Secondary | ICD-10-CM

## 2021-10-02 NOTE — L&D Delivery Note (Signed)
Delivery Note  Tamara Harrison is a [id number] at [redacted]w[redacted]d with an LMP of 01/23/21, consistent with [place] at [redacted]w[redacted]d.   First Stage: Labor onset: 0930 Induction: misoprostol Analgesia /Anesthesia intrapartum: Epidural AROM at 1103  Second Stage: Complete dilation at 1205 Onset of pushing at 1210 FHR second stage 150  Delivery of a viable baby Boy on 10/25/2021  at 1215 by CNM Delivery of fetal head in OA position with restitution to LOA. no nuchal cord;  Anterior then posterior shoulders delivered easily with gentle downward traction. Baby placed on mom's chest, and attended to by baby RN Cord double clamped after cessation of pulsation, cut by Maudry Mayhew brother  Cord blood sample collection: Yes O POS Collection of cord blood donation N/A Arterial cord blood sample N/A  Third Stage: Oxytocin bolus started after delivery of infant for hemorrhage prophylaxis  Placenta delivered Delena Bali intact with 3 VC @ 1219 Placenta disposition: discarded per protocol Uterine tone firm / bleeding small  no laceration identified  Anesthesia for repair: N/A Repair N/A Est. Blood Loss (mL): 022  Complications: None  Mom to postpartum.  Baby to Couplet care / Skin to Skin.  Newborn: Information for the patient's newborn:  Tamara Harrison, Tamara Harrison [[id number]]  Live born female  Birth Weight: 7 lb 12.9 oz (3540 g) APGAR: 8, 9  Newborn Delivery   Birth date/time: 10/25/2021 12:13:00 Delivery type: Vaginal, Spontaneous        Feeding planned: TBD  ---------- Avelino Leeds, CNM Certified Nurse Midwife [facility]

## 2021-10-04 ENCOUNTER — Ambulatory Visit: Payer: Medicaid Other | Attending: Obstetrics and Gynecology

## 2021-10-04 ENCOUNTER — Other Ambulatory Visit: Payer: Self-pay

## 2021-10-04 DIAGNOSIS — E039 Hypothyroidism, unspecified: Secondary | ICD-10-CM | POA: Diagnosis not present

## 2021-10-04 DIAGNOSIS — E669 Obesity, unspecified: Secondary | ICD-10-CM | POA: Insufficient documentation

## 2021-10-04 DIAGNOSIS — O99283 Endocrine, nutritional and metabolic diseases complicating pregnancy, third trimester: Secondary | ICD-10-CM | POA: Diagnosis present

## 2021-10-04 DIAGNOSIS — O99213 Obesity complicating pregnancy, third trimester: Secondary | ICD-10-CM | POA: Diagnosis not present

## 2021-10-04 DIAGNOSIS — Z3A36 36 weeks gestation of pregnancy: Secondary | ICD-10-CM | POA: Diagnosis not present

## 2021-10-04 DIAGNOSIS — O99280 Endocrine, nutritional and metabolic diseases complicating pregnancy, unspecified trimester: Secondary | ICD-10-CM

## 2021-10-06 LAB — OB RESULTS CONSOLE GC/CHLAMYDIA
Chlamydia: NEGATIVE
Gonorrhea: NEGATIVE

## 2021-10-06 LAB — OB RESULTS CONSOLE GBS: GBS: NEGATIVE

## 2021-10-10 ENCOUNTER — Other Ambulatory Visit: Payer: Self-pay

## 2021-10-10 DIAGNOSIS — E039 Hypothyroidism, unspecified: Secondary | ICD-10-CM

## 2021-10-10 DIAGNOSIS — O99280 Endocrine, nutritional and metabolic diseases complicating pregnancy, unspecified trimester: Secondary | ICD-10-CM

## 2021-10-11 ENCOUNTER — Ambulatory Visit: Payer: Medicaid Other | Attending: Obstetrics

## 2021-10-11 ENCOUNTER — Other Ambulatory Visit: Payer: Self-pay

## 2021-10-11 DIAGNOSIS — O3413 Maternal care for benign tumor of corpus uteri, third trimester: Secondary | ICD-10-CM | POA: Insufficient documentation

## 2021-10-11 DIAGNOSIS — Z3A37 37 weeks gestation of pregnancy: Secondary | ICD-10-CM | POA: Diagnosis not present

## 2021-10-11 DIAGNOSIS — O09891 Supervision of other high risk pregnancies, first trimester: Secondary | ICD-10-CM

## 2021-10-11 DIAGNOSIS — O99213 Obesity complicating pregnancy, third trimester: Secondary | ICD-10-CM

## 2021-10-11 DIAGNOSIS — D259 Leiomyoma of uterus, unspecified: Secondary | ICD-10-CM

## 2021-10-11 DIAGNOSIS — O99283 Endocrine, nutritional and metabolic diseases complicating pregnancy, third trimester: Secondary | ICD-10-CM | POA: Insufficient documentation

## 2021-10-11 DIAGNOSIS — E039 Hypothyroidism, unspecified: Secondary | ICD-10-CM | POA: Diagnosis not present

## 2021-10-18 ENCOUNTER — Ambulatory Visit: Payer: Medicaid Other | Attending: Maternal & Fetal Medicine | Admitting: Maternal & Fetal Medicine

## 2021-10-18 ENCOUNTER — Other Ambulatory Visit: Payer: Self-pay

## 2021-10-18 DIAGNOSIS — E039 Hypothyroidism, unspecified: Secondary | ICD-10-CM

## 2021-10-18 DIAGNOSIS — O99213 Obesity complicating pregnancy, third trimester: Secondary | ICD-10-CM | POA: Diagnosis not present

## 2021-10-18 DIAGNOSIS — O99283 Endocrine, nutritional and metabolic diseases complicating pregnancy, third trimester: Secondary | ICD-10-CM

## 2021-10-18 DIAGNOSIS — Z3A38 38 weeks gestation of pregnancy: Secondary | ICD-10-CM

## 2021-10-18 NOTE — Procedures (Signed)
Tamara Harrison [birth year] [redacted]w[redacted]d  Fetus A Non-Stress Test Interpretation for 10/18/21  Indication:  Elevated BMI, Hypothyroid  Started @ 0921 Stopped @ 0940  Fetal Heart Rate A Mode: External Baseline Rate (A): 158 bpm Variability: Moderate Accelerations: 15 x 15 Decelerations: None Multiple birth?: No     Interpretation (Fetal Testing) Nonstress Test Interpretation: Reactive (Dr. Gertie Exon read NST)

## 2021-10-19 ENCOUNTER — Other Ambulatory Visit: Payer: Self-pay | Admitting: Advanced Practice Midwife

## 2021-10-23 ENCOUNTER — Other Ambulatory Visit: Payer: Self-pay | Admitting: Advanced Practice Midwife

## 2021-10-24 ENCOUNTER — Other Ambulatory Visit: Payer: Self-pay | Admitting: Obstetrics and Gynecology

## 2021-10-24 NOTE — Progress Notes (Signed)
Dating: EDD: 10/30/21  by LMP: 01/23/21 and c/w [place] at [redacted]w[redacted]d  Preg c/b: Obesity, BMI 46.5 Mental health dx: depression on Zoloft currently. Intellectual disability, GAD, PTSD, mild ADHD.  Varicella NON-immune Hypothyroidism, off meds with normal TSH  Prenatal Labs: Blood type/Rh O pos  Antibody screen neg  Rubella Immune  Varicella NON-Immune  RPR NR  HBsAg Neg  HIV NR  GC neg  Chlamydia neg  Genetic screening Not performed  1 hour GTT 124  3 hour GTT   GBS Neg   MFM anatomy US: normal anatomy with S=D, EFW 38th%, anterior placenta  Contraception: TBD Infant feeding:breast Tdap: 07/28/21 Flu: 07/28/21

## 2021-10-25 ENCOUNTER — Other Ambulatory Visit: Payer: Self-pay

## 2021-10-25 ENCOUNTER — Inpatient Hospital Stay: Payer: Medicaid Other | Admitting: Anesthesiology

## 2021-10-25 ENCOUNTER — Inpatient Hospital Stay
Admission: EM | Admit: 2021-10-25 | Discharge: 2021-10-26 | DRG: 806 | Disposition: A | Discharge status: Home / Self Care | Payer: Medicaid Other | Attending: Obstetrics | Admitting: Obstetrics

## 2021-10-25 ENCOUNTER — Encounter: Payer: Self-pay | Admitting: Obstetrics and Gynecology

## 2021-10-25 DIAGNOSIS — D219 Benign neoplasm of connective and other soft tissue, unspecified: Secondary | ICD-10-CM | POA: Diagnosis present

## 2021-10-25 DIAGNOSIS — F4310 Post-traumatic stress disorder, unspecified: Secondary | ICD-10-CM | POA: Diagnosis present

## 2021-10-25 DIAGNOSIS — O3413 Maternal care for benign tumor of corpus uteri, third trimester: Secondary | ICD-10-CM | POA: Diagnosis present

## 2021-10-25 DIAGNOSIS — F70 Mild intellectual disabilities: Secondary | ICD-10-CM | POA: Diagnosis present

## 2021-10-25 DIAGNOSIS — Z79899 Other long term (current) drug therapy: Secondary | ICD-10-CM

## 2021-10-25 DIAGNOSIS — F32A Depression, unspecified: Secondary | ICD-10-CM | POA: Diagnosis present

## 2021-10-25 DIAGNOSIS — Z20822 Contact with and (suspected) exposure to covid-19: Secondary | ICD-10-CM | POA: Diagnosis present

## 2021-10-25 DIAGNOSIS — F909 Attention-deficit hyperactivity disorder, unspecified type: Secondary | ICD-10-CM | POA: Diagnosis present

## 2021-10-25 DIAGNOSIS — E039 Hypothyroidism, unspecified: Secondary | ICD-10-CM | POA: Diagnosis present

## 2021-10-25 DIAGNOSIS — O99214 Obesity complicating childbirth: Secondary | ICD-10-CM | POA: Diagnosis present

## 2021-10-25 DIAGNOSIS — Z23 Encounter for immunization: Secondary | ICD-10-CM

## 2021-10-25 DIAGNOSIS — Z3A39 39 weeks gestation of pregnancy: Secondary | ICD-10-CM

## 2021-10-25 DIAGNOSIS — D259 Leiomyoma of uterus, unspecified: Secondary | ICD-10-CM | POA: Diagnosis present

## 2021-10-25 DIAGNOSIS — O99213 Obesity complicating pregnancy, third trimester: Secondary | ICD-10-CM | POA: Diagnosis present

## 2021-10-25 DIAGNOSIS — O99344 Other mental disorders complicating childbirth: Secondary | ICD-10-CM | POA: Diagnosis present

## 2021-10-25 DIAGNOSIS — O99284 Endocrine, nutritional and metabolic diseases complicating childbirth: Secondary | ICD-10-CM | POA: Diagnosis present

## 2021-10-25 DIAGNOSIS — D62 Acute posthemorrhagic anemia: Secondary | ICD-10-CM | POA: Diagnosis not present

## 2021-10-25 DIAGNOSIS — O9081 Anemia of the puerperium: Secondary | ICD-10-CM | POA: Diagnosis not present

## 2021-10-25 LAB — COMPREHENSIVE METABOLIC PANEL
ALT: 62 U/L — ABNORMAL HIGH (ref 0–44)
AST: 62 U/L — ABNORMAL HIGH (ref 15–41)
Albumin: 2.9 g/dL — ABNORMAL LOW (ref 3.5–5.0)
Alkaline Phosphatase: 176 U/L — ABNORMAL HIGH (ref 38–126)
Anion gap: 6 (ref 5–15)
BUN: 11 mg/dL (ref 6–20)
CO2: 22 mmol/L (ref 22–32)
Calcium: 9 mg/dL (ref 8.9–10.3)
Chloride: 106 mmol/L (ref 98–111)
Creatinine, Ser: 0.52 mg/dL (ref 0.44–1.00)
GFR, Estimated: >60 mL/min (ref >60)
Glucose, Bld: 106 mg/dL — ABNORMAL HIGH (ref 70–99)
Potassium: 3.9 mmol/L (ref 3.5–5.1)
Sodium: 134 mmol/L — ABNORMAL LOW (ref 135–145)
Total Bilirubin: 0.3 mg/dL (ref 0.3–1.2)
Total Protein: 6.9 g/dL (ref 6.5–8.1)

## 2021-10-25 LAB — URINE DRUG SCREEN, QUALITATIVE (ARMC ONLY)
Amphetamines, Ur Screen: NOT DETECTED
Barbiturates, Ur Screen: NOT DETECTED
Benzodiazepine, Ur Scrn: NOT DETECTED
Cannabinoid 50 Ng, Ur ~~LOC~~: NOT DETECTED
Cocaine Metabolite,Ur ~~LOC~~: NOT DETECTED
MDMA (Ecstasy)Ur Screen: NOT DETECTED
Methadone Scn, Ur: NOT DETECTED
Opiate, Ur Screen: POSITIVE — AB
Phencyclidine (PCP) Ur S: NOT DETECTED
Tricyclic, Ur Screen: NOT DETECTED

## 2021-10-25 LAB — CBC
HCT: 36.1 % (ref 36.0–46.0)
Hemoglobin: 12 g/dL (ref 12.0–15.0)
MCH: 27.1 pg (ref 26.0–34.0)
MCHC: 33.2 g/dL (ref 30.0–36.0)
MCV: 81.5 fL (ref 80.0–100.0)
Platelets: 312 10*3/uL (ref 150–400)
RBC: 4.43 MIL/uL (ref 3.87–5.11)
RDW: 13.3 % (ref 11.5–15.5)
WBC: 10.8 10*3/uL — ABNORMAL HIGH (ref 4.0–10.5)
nRBC: 0 % (ref 0.0–0.2)

## 2021-10-25 LAB — PROTEIN / CREATININE RATIO, URINE
Creatinine, Urine: 103 mg/dL
Protein Creatinine Ratio: 0.17 mg/mg{Cre} — ABNORMAL HIGH (ref 0.00–0.15)
Total Protein, Urine: 17 mg/dL

## 2021-10-25 LAB — RESP PANEL BY RT-PCR (FLU A&B, COVID) ARPGX2
Influenza A by PCR: NEGATIVE
Influenza B by PCR: NEGATIVE
SARS Coronavirus 2 by RT PCR: NEGATIVE

## 2021-10-25 LAB — TYPE AND SCREEN
ABO/RH(D): O POS
Antibody Screen: NEGATIVE

## 2021-10-25 LAB — RPR: RPR Ser Ql: NONREACTIVE

## 2021-10-25 MED ORDER — PHENYLEPHRINE 40 MCG/ML (10ML) SYRINGE FOR IV PUSH (FOR BLOOD PRESSURE SUPPORT)
80.0000 ug | PREFILLED_SYRINGE | INTRAVENOUS | Status: DC | PRN
  Filled 2021-10-25: qty 10

## 2021-10-25 MED ORDER — ACETAMINOPHEN 325 MG PO TABS: 650.0000 mg | ORAL_TABLET | ORAL | Status: DC | PRN

## 2021-10-25 MED ORDER — LIDOCAINE HCL (PF) 1 % IJ SOLN
INTRAMUSCULAR | Status: DC | PRN
  Administered 2021-10-25: 3 mL via SUBCUTANEOUS

## 2021-10-25 MED ORDER — DIBUCAINE (PERIANAL) 1 % EX OINT: 1.0000 "application " | TOPICAL_OINTMENT | CUTANEOUS | Status: DC | PRN

## 2021-10-25 MED ORDER — WITCH HAZEL-GLYCERIN EX PADS: 1.0000 "application " | MEDICATED_PAD | CUTANEOUS | Status: DC | PRN

## 2021-10-25 MED ORDER — FENTANYL-BUPIVACAINE-NACL 0.5-0.125-0.9 MG/250ML-% EP SOLN
EPIDURAL | Status: AC
  Filled 2021-10-25: qty 250

## 2021-10-25 MED ORDER — ONDANSETRON HCL 4 MG PO TABS: 4.0000 mg | ORAL_TABLET | ORAL | Status: DC | PRN

## 2021-10-25 MED ORDER — LIDOCAINE HCL (PF) 1 % IJ SOLN
30.0000 mL | INTRAMUSCULAR | Status: DC | PRN
  Filled 2021-10-25: qty 30

## 2021-10-25 MED ORDER — ZOLPIDEM TARTRATE 5 MG PO TABS: 5.0000 mg | ORAL_TABLET | Freq: Every evening | ORAL | Status: DC | PRN

## 2021-10-25 MED ORDER — EPHEDRINE 5 MG/ML INJ
10.0000 mg | INTRAVENOUS | Status: DC | PRN
  Filled 2021-10-25: qty 2

## 2021-10-25 MED ORDER — MISOPROSTOL 200 MCG PO TABS
ORAL_TABLET | ORAL | Status: AC
  Filled 2021-10-25: qty 4

## 2021-10-25 MED ORDER — SENNOSIDES-DOCUSATE SODIUM 8.6-50 MG PO TABS
2.0000 | ORAL_TABLET | Freq: Every day | ORAL | Status: DC
  Administered 2021-10-26: 11:00:00 2 via ORAL
  Filled 2021-10-25: qty 2

## 2021-10-25 MED ORDER — LIDOCAINE HCL (PF) 1 % IJ SOLN
INTRAMUSCULAR | Status: AC
  Filled 2021-10-25: qty 30

## 2021-10-25 MED ORDER — PRENATAL MULTIVITAMIN CH: 1.0000 | ORAL_TABLET | Freq: Every day | ORAL | Status: DC

## 2021-10-25 MED ORDER — PRENATAL MULTIVITAMIN CH
1.0000 | ORAL_TABLET | Freq: Every day | ORAL | Status: DC
  Administered 2021-10-26: 12:00:00 1 via ORAL
  Filled 2021-10-25: qty 1

## 2021-10-25 MED ORDER — AMMONIA AROMATIC IN INHA
RESPIRATORY_TRACT | Status: AC
  Filled 2021-10-25: qty 10

## 2021-10-25 MED ORDER — OXYTOCIN-SODIUM CHLORIDE 30-0.9 UT/500ML-% IV SOLN: 1.0000 m[IU]/min | INTRAVENOUS | Status: DC

## 2021-10-25 MED ORDER — SOD CITRATE-CITRIC ACID 500-334 MG/5ML PO SOLN: 30.0000 mL | ORAL | Status: DC | PRN

## 2021-10-25 MED ORDER — FENTANYL-BUPIVACAINE-NACL 0.5-0.125-0.9 MG/250ML-% EP SOLN
12.0000 mL/h | EPIDURAL | Status: DC | PRN
  Administered 2021-10-25: 10:00:00 12 mL/h via EPIDURAL

## 2021-10-25 MED ORDER — DIPHENHYDRAMINE HCL 25 MG PO CAPS: 25.0000 mg | ORAL_CAPSULE | Freq: Four times a day (QID) | ORAL | Status: DC | PRN

## 2021-10-25 MED ORDER — OXYTOCIN-SODIUM CHLORIDE 30-0.9 UT/500ML-% IV SOLN
2.5000 [IU]/h | INTRAVENOUS | Status: DC
  Administered 2021-10-25: 17:00:00 2.5 [IU]/h via INTRAVENOUS
  Filled 2021-10-25: qty 500

## 2021-10-25 MED ORDER — OXYTOCIN BOLUS FROM INFUSION
333.0000 mL | Freq: Once | INTRAVENOUS | Status: AC
  Administered 2021-10-25: 12:00:00 333 mL via INTRAVENOUS

## 2021-10-25 MED ORDER — LIDOCAINE-EPINEPHRINE (PF) 1.5 %-1:200000 IJ SOLN
INTRAMUSCULAR | Status: DC | PRN
  Administered 2021-10-25: 3 mL via EPIDURAL

## 2021-10-25 MED ORDER — TETANUS-DIPHTH-ACELL PERTUSSIS 5-2.5-18.5 LF-MCG/0.5 IM SUSY
0.5000 mL | PREFILLED_SYRINGE | Freq: Once | INTRAMUSCULAR | Status: DC
  Filled 2021-10-25: qty 0.5

## 2021-10-25 MED ORDER — SERTRALINE HCL 25 MG PO TABS
125.0000 mg | ORAL_TABLET | Freq: Every day | ORAL | Status: DC
  Administered 2021-10-26: 11:00:00 25 mg via ORAL
  Administered 2021-10-26: 11:00:00 100 mg via ORAL
  Filled 2021-10-25: qty 1

## 2021-10-25 MED ORDER — IBUPROFEN 600 MG PO TABS
600.0000 mg | ORAL_TABLET | Freq: Four times a day (QID) | ORAL | Status: DC
  Administered 2021-10-25 – 2021-10-26 (×4): 600 mg via ORAL
  Filled 2021-10-25 (×4): qty 1

## 2021-10-25 MED ORDER — LACTATED RINGERS IV SOLN: 500.0000 mL | INTRAVENOUS | Status: DC | PRN

## 2021-10-25 MED ORDER — ONDANSETRON HCL 4 MG/2ML IJ SOLN: 4.0000 mg | Freq: Four times a day (QID) | INTRAMUSCULAR | Status: DC | PRN

## 2021-10-25 MED ORDER — BENZOCAINE-MENTHOL 20-0.5 % EX AERO: 1.0000 "application " | INHALATION_SPRAY | CUTANEOUS | Status: DC | PRN

## 2021-10-25 MED ORDER — COCONUT OIL OIL: 1.0000 "application " | TOPICAL_OIL | Status: DC | PRN

## 2021-10-25 MED ORDER — DIPHENHYDRAMINE HCL 50 MG/ML IJ SOLN: 12.5000 mg | INTRAMUSCULAR | Status: DC | PRN

## 2021-10-25 MED ORDER — BUPIVACAINE HCL (PF) 0.25 % IJ SOLN
INTRAMUSCULAR | Status: DC | PRN
  Administered 2021-10-25: 3 mL via EPIDURAL
  Administered 2021-10-25: 5 mL via EPIDURAL

## 2021-10-25 MED ORDER — SIMETHICONE 80 MG PO CHEW: 80.0000 mg | CHEWABLE_TABLET | ORAL | Status: DC | PRN

## 2021-10-25 MED ORDER — MISOPROSTOL 25 MCG QUARTER TABLET
25.0000 ug | ORAL_TABLET | ORAL | Status: DC | PRN
  Administered 2021-10-25 (×2): 25 ug via BUCCAL
  Filled 2021-10-25 (×3): qty 1

## 2021-10-25 MED ORDER — LACTATED RINGERS IV SOLN
500.0000 mL | Freq: Once | INTRAVENOUS | Status: AC
  Administered 2021-10-25: 10:00:00 500 mL via INTRAVENOUS

## 2021-10-25 MED ORDER — OXYTOCIN 10 UNIT/ML IJ SOLN
INTRAMUSCULAR | Status: AC
  Filled 2021-10-25: qty 2

## 2021-10-25 MED ORDER — LACTATED RINGERS IV SOLN: INTRAVENOUS | Status: DC

## 2021-10-25 MED ORDER — MISOPROSTOL 25 MCG QUARTER TABLET
25.0000 ug | ORAL_TABLET | ORAL | Status: DC | PRN
  Administered 2021-10-25 (×2): 25 ug via VAGINAL
  Filled 2021-10-25 (×3): qty 1

## 2021-10-25 MED ORDER — TERBUTALINE SULFATE 1 MG/ML IJ SOLN: 0.2500 mg | Freq: Once | INTRAMUSCULAR | Status: DC | PRN

## 2021-10-25 MED ORDER — OXYTOCIN-SODIUM CHLORIDE 30-0.9 UT/500ML-% IV SOLN
INTRAVENOUS | Status: AC
  Filled 2021-10-25: qty 500

## 2021-10-25 MED ORDER — ONDANSETRON HCL 4 MG/2ML IJ SOLN: 4.0000 mg | INTRAMUSCULAR | Status: DC | PRN

## 2021-10-25 NOTE — H&P (Signed)
OB History & Physical   History of Present Illness:  Chief Complaint: induction  HPI:  Tamara Harrison is a 26 y.o. G63P1001 female at [redacted]w[redacted]d dated by LMP and c/w [place] at [redacted]w[redacted]d.  She presents to L&D for scheduled IOL due to obesity and hypothyroidism.  Reports active FM; occasional UCs, no LOF or VB.    Pregnancy Issues: Obesity, BMI 46.5 Mental health dx: depression on Zoloft currently. Intellectual disability, GAD, PTSD, mild ADHD.  Varicella NON-immune Hypothyroidism, off meds with normal TSH   Maternal Medical History:   Past Medical History:  Diagnosis Date   Thyroid disease     History reviewed. No pertinent surgical history.  No Known Allergies  Prior to Admission medications   Medication Sig Start Date End Date Taking? Authorizing Provider  ondansetron (ZOFRAN ODT) 4 MG disintegrating tablet Take 1 tablet (4 mg total) by mouth every 8 (eight) hours as needed for refractory nausea / vomiting. 03/02/21  Yes Duffy Bruce, MD  Prenatal Vit-Fe Fumarate-FA (PRENATAL MULTIVITAMIN) TABS tablet Take 1 tablet by mouth daily at 12 noon.   Yes [provider]  sertraline (ZOLOFT) 100 MG tablet Take 125 mg by mouth daily.   Yes [provider]  aspirin 81 MG chewable tablet Chew 81 mg by mouth daily. Patient not taking: Reported on 10/18/2021    [provider]  Doxylamine-Pyridoxine (DICLEGIS) 10-10 MG TBEC Take two tablets at bedtime on day 1 and 2; if symptoms persist, take 1 tab in AM and 2 tabs at bedtime day 3, then 1 tab every 6 hr PRN Patient not taking: Reported on 06/21/2021 03/02/21   Duffy Bruce, MD     Prenatal care site: [facility].   Social History: She  reports that she has never smoked. She has never used smokeless tobacco. She reports that she does not drink alcohol and does not use drugs.  Family History: family history includes Diabetes in her father.   Review of Systems: A full review of systems was performed and  negative except as noted in the HPI.     Physical Exam:  Vital Signs: BP (!) 138/59 (BP Location: Left Arm)    Pulse 76    Temp 98.2 F (36.8 C) (Oral)    Resp 16    Ht 5\' 6"  (1.676 m)    Wt 130.9 kg    LMP 01/23/2021    BMI 46.57 kg/m   General: no acute distress.  HEENT: normocephalic, atraumatic Heart: regular rate & rhythm.  No murmurs/rubs/gallops Lungs: clear to auscultation bilaterally, normal respiratory effort Abdomen: soft, gravid, non-tender;  EFW: 8.5lbs Pelvic:   External: Normal external female genitalia  Cervix: Dilation: 1.5 / Effacement (%): 50 / Station: -1    Extremities: non-tender, symmetric, no edema bilaterally.  DTRs: 2+  Neurologic: Alert & oriented x 3.    Results for orders placed or performed during the hospital encounter of 10/25/21 (from the past 24 hour(s))  CBC     Status: Abnormal   Collection Time: 10/25/21 12:42 AM  Result Value Ref Range   WBC 10.8 (H) 4.0 - 10.5 K/uL   RBC 4.43 3.87 - 5.11 MIL/uL   Hemoglobin 12.0 12.0 - 15.0 g/dL   HCT 36.1 36.0 - 46.0 %   MCV 81.5 80.0 - 100.0 fL   MCH 27.1 26.0 - 34.0 pg   MCHC 33.2 30.0 - 36.0 g/dL   RDW 13.3 11.5 - 15.5 %   Platelets 312 150 - 400  K/uL   nRBC 0.0 0.0 - 0.2 %  Type and screen     Status: None (Preliminary result)   Collection Time: 10/25/21 12:42 AM  Result Value Ref Range   ABO/RH(D) PENDING    Antibody Screen PENDING    Sample Expiration      10/28/2021,2359 Performed at [facility], [address], [place], [place] [address]     Pertinent Results:  Prenatal Labs: Blood type/Rh O Pos  Antibody screen neg  Rubella Immune  Varicella NON-Immune  RPR NR  HBsAg Neg  HIV NR  GC neg  Chlamydia neg  Genetic screening Not performed  1 hour GTT  124  3 hour GTT   GBS  Neg   MFM anatomy US: normal anatomy with S=D, EFW 38th%, anterior placenta MFM Growth [place]: 09/21/21 EFW 2786g, 87%, AFI 10.57cm  FHT: 150bpm, mod variability, + accels, no decels TOCO:  q6-46min SVE:  Dilation: 1.5 / Effacement (%): 50 / Station: -1    Cephalic by leopolds/exam  [place] MFM FETAL BPP WO NON STRESS  Result Date: 10/11/2021 ----------------------------------------------------------------------  OBSTETRICS REPORT                       (Signed Final 10/11/2021 02:47 pm) ---------------------------------------------------------------------- Patient Info  ID #:       [id number]                          D.O.B.:  [birth year] (25 yrs)  Name:       Tamara Harrison                  Visit Date: 10/11/2021 02:14 pm ---------------------------------------------------------------------- Performed By  Attending:        Johnell Comings MD         Ref. Address:     [address]                                                             [place], [place]  Performed By:     Wilnette Kales        Location[facility]                    RDMS,RVT                                 [facility] Care at  [facility]  Referred By:      Donnie Coffin                    MD ---------------------------------------------------------------------- Orders  #  Description                           Code        Ordered By  1  [place] MFM FETAL BPP WO NON               76819.01    CORENTHIAN     STRESS                                            BOOKER ----------------------------------------------------------------------  #  Order #                     Accession #                Episode #  1  [id number]                   [id number]                 [id number] ---------------------------------------------------------------------- Indications  Obesity complicating pregnancy, second         O99.212  trimester (>40)  Hypothyroid                                    O99.280 E03.9  [redacted] weeks gestation of pregnancy                Z3A.37  ---------------------------------------------------------------------- Fetal Evaluation  Num Of Fetuses:         1  Fetal Heart Rate(bpm):  167  Cardiac Activity:       Observed  Presentation:           Cephalic  Placenta:               Anterior  Amniotic Fluid  AFI FV:      Within normal limits  AFI Sum(cm)     %Tile       Largest Pocket(cm)  15.6            59          7  RUQ(cm)       RLQ(cm)       LUQ(cm)        LLQ(cm)  5.2           2             7               1.4 ---------------------------------------------------------------------- Biophysical Evaluation  Amniotic F.V:   Within normal limits       F. Tone:        Observed  F. Movement:    Observed                   Score:          8/8  F. Breathing:   Observed ---------------------------------------------------------------------- OB History  Gravidity:    2         Term:   1  Living:       1 ---------------------------------------------------------------------- Gestational Age  LMP:           37w 2d        Date:  01/23/21                 EDD:   10/30/21  Best:          37w 2d     Det. By:  LMP  (01/23/21)          EDD:   10/30/21 ---------------------------------------------------------------------- Anatomy  Lips:                  Appears normal         Kidneys:                Appear normal  Stomach:               Appears normal, left   Bladder:                Appears normal                         sided  Other:  Fetus appears to be a female. Technicallly difficult due to advanced GA          and maternal habitus. ---------------------------------------------------------------------- Comments  This patient was seen for a biophysical profile due to  maternal obesity and hypothyroidism.  She denies any  problems since her last exam.  A biophysical profile performed today was 8 out of 8.  There was normal amniotic fluid noted on today's ultrasound  exam.  She will return in 1 week for an NST. ----------------------------------------------------------------------                    Johnell Comings, MD Electronically Signed Final Report   10/11/2021 02:47 pm ----------------------------------------------------------------------  [place] MFM FETAL BPP WO NON STRESS  Result Date: 10/04/2021 ----------------------------------------------------------------------  OBSTETRICS REPORT                       (Signed Final 10/04/2021 11:12 am) ---------------------------------------------------------------------- Patient Info  ID #:       [id number]                          D.O.B.:  [birth year] (25 yrs)  Name:       Tamara Harrison                  Visit Date: 10/04/2021 09:28 am ---------------------------------------------------------------------- Performed By  Attending:        Tama High MD        Ref. Address:     [address], [place]  [place]  Performed By:     Wilnette Kales        Location[facility]         [facility]                    RDMS,RVT                                 Fetal Care at                                                             [facility]  Referred By:      Donnie Coffin                    MD ---------------------------------------------------------------------- Orders  #  Description                           Code        Ordered By  1  [place] MFM FETAL BPP WO NON               76819.01    RAVI [facility]     STRESS ----------------------------------------------------------------------  #  Order #                     Accession #                Episode #  1  [id number]                   [id number]                 [id number] ---------------------------------------------------------------------- Indications  Obesity complicating pregnancy, second         O99.212  trimester (>40)  Hypothyroid                                    O99.280 E03.9  [redacted] weeks gestation of pregnancy                 Z3A.36 ---------------------------------------------------------------------- Fetal Evaluation  Num Of Fetuses:         1  Fetal Heart Rate(bpm):  132  Cardiac Activity:       Observed  Presentation:           Cephalic  Placenta:               Anterior  Amniotic Fluid  AFI FV:      Within normal limits  AFI Sum(cm)     %Tile       Largest Pocket(cm)  13.1            45          4.5  RUQ(cm)       RLQ(cm)       LUQ(cm)        LLQ(cm)  4.5           2.9           4.4            1.3 ---------------------------------------------------------------------- Biophysical Evaluation  Amniotic  F.V:   Within normal limits       F. Tone:        Observed  F. Movement:    Observed                   Score:          8/8  F. Breathing:   Observed ---------------------------------------------------------------------- OB History  Gravidity:    2         Term:   1  Living:       1 ---------------------------------------------------------------------- Gestational Age  LMP:           36w 2d        Date:  01/23/21                 EDD:   10/30/21  Best:          Harolyn Rutherford 2d     Det. By:  LMP  (01/23/21)          EDD:   10/30/21 ---------------------------------------------------------------------- Anatomy  Stomach:               Appears normal, left   Kidneys:                Appear normal                         sided  Cord Vessels:          Appears normal (3      Bladder:                Appears normal                         vessel cord) ---------------------------------------------------------------------- Impression  Maternal obesity.  BP today at our office is 109/75 mm Hg.  Amniotic fluid is normal and good fetal activity is seen  .Antenatal testing is reassuring. BPP 8/8. ---------------------------------------------------------------------- Recommendations  -Patient has an appointment for BPP next week.  -Continue weekly BPP or NST till delivery. ----------------------------------------------------------------------                   Tama High, MD Electronically Signed Final Report   10/04/2021 11:12 am ----------------------------------------------------------------------  [place] MFM FETAL BPP WO NON STRESS  Result Date: 09/27/2021 ----------------------------------------------------------------------  OBSTETRICS REPORT                       (Signed Final 09/27/2021 10:15 am) ---------------------------------------------------------------------- Patient Info  ID #:       [id number]                          D.O.B.:  Jun 28, 1996 (25 yrs)  Name:       Tamara Harrison                  Visit Date: 09/27/2021 08:48 am ---------------------------------------------------------------------- Performed By  Attending:        Johnell Comings MD         Ref. Address:     [address]  [place],                                                             [place], [place]  Performed By:     Wilnette Kales        Location[facility]         [facility]                    RDMS,RVT                                 Fetal Care at                                                             [facility]  Referred By:      Donnie Coffin                    MD ---------------------------------------------------------------------- Orders  #  Description                           Code        Ordered By  1  [place] MFM FETAL BPP WO NON               76819.01    YU FANG     STRESS ----------------------------------------------------------------------  #  Order #                     Accession #                Episode #  1  [id number]                   [id number]                 [id number] ---------------------------------------------------------------------- Indications  Obesity complicating pregnancy, second         O99.212  trimester (>40)  Hypothyroid                                    O99.280 E03.9  [redacted] weeks gestation of pregnancy                Z3A.35  ---------------------------------------------------------------------- Fetal Evaluation  Num Of Fetuses:         1  Fetal Heart Rate(bpm):  138  Cardiac Activity:       Observed  Presentation:           Cephalic  Placenta:  Anterior  P. Cord Insertion:      Previously Visualized  Amniotic Fluid  AFI FV:      Within normal limits  AFI Sum(cm)     %Tile       Largest Pocket(cm)  12.1            36          4  RUQ(cm)       RLQ(cm)       LUQ(cm)        LLQ(cm)  4             3.2           3.5            1.4 ---------------------------------------------------------------------- Biophysical Evaluation  Amniotic F.V:   Within normal limits       F. Tone:        Observed  F. Movement:    Observed                   Score:          8/8  F. Breathing:   Observed ---------------------------------------------------------------------- OB History  Gravidity:    2         Term:   1  Living:       1 ---------------------------------------------------------------------- Gestational Age  LMP:           35w 2d        Date:  01/23/21                 EDD:   10/30/21  Best:          Barbie Haggis 2d     Det. By:  LMP  (01/23/21)          EDD:   10/30/21 ---------------------------------------------------------------------- Anatomy  Cranium:               Appears normal         Stomach:                Appears normal, left                                                                        sided  Ductal Arch:           Appears normal         Kidneys:                Appear normal ---------------------------------------------------------------------- Comments  This patient was seen for a biophysical profile due to  maternal obesity and hypothyroidism.  She denies any  problems since her last exam.  A biophysical profile performed today was 8 out of 8.  There was normal amniotic fluid noted on today's ultrasound  exam.  She will return in 1 week for another BPP. ----------------------------------------------------------------------                    Johnell Comings, MD Electronically Signed Final Report   09/27/2021 10:15 am ----------------------------------------------------------------------   Assessment:  Tamara Harrison is a 26 y.o. G2P1001 female at [redacted]w[redacted]d with IOL for morbid obesity and hypothyroidism.   Plan:  1. Admit to Labor & Delivery; consents reviewed and obtained  2. Fetal Well being  - Fetal Tracing: Cat I tracing - Group B Streptococcus ppx indicated: Neg - Presentation: cephalic confirmed by exam   3. Routine OB: - Prenatal labs reviewed, as above - Rh O P os - CBC, T&S, RPR on admit - Clear fluids, IVF  4. Induction of Labor -  Contractions: external toco in place -  Pelvis proven to 7#9 -  Plan for induction with cytotec -  Plan for continuous fetal monitoring  -  Maternal pain control as desired - Anticipate vaginal delivery  5. Post Partum Planning: - Infant feeding: breast - Contraception: TBD - flu: 07/28/21 - Tdap 07/28/21  Murray Hodgkins McDonough, CNM 10/25/21 1:06 AM

## 2021-10-25 NOTE — Anesthesia Procedure Notes (Signed)
Epidural Patient location during procedure: OB Start time: 10/25/2021 9:57 AM End time: 10/25/2021 9:59 AM  Staffing Anesthesiologist: Piscitello, Precious Haws, MD Resident/CRNA: Hedda Slade, CRNA Performed: resident/CRNA   Preanesthetic Checklist Completed: patient identified, IV checked, site marked, risks and benefits discussed, surgical consent, monitors and equipment checked, pre-op evaluation and timeout performed  Epidural Patient position: sitting Prep: ChloraPrep Patient monitoring: heart rate, continuous pulse ox and blood pressure Approach: midline Location: L3-L4 Injection technique: LOR saline  Needle:  Needle type: Tuohy  Needle gauge: 17 G Needle length: 9 cm and 9 Needle insertion depth: 8 cm Catheter type: closed end flexible Catheter size: 19 Gauge Catheter at skin depth: 13 cm Test dose: negative and 1.5% lidocaine with Epi 1:200 K  Assessment Sensory level: T10 Events: blood not aspirated, injection not painful, no injection resistance, no paresthesia and negative IV test  Additional Notes 1 attempt Pt. Evaluated and documentation done after procedure finished. Patient identified. Risks/Benefits/Options discussed with patient including but not limited to bleeding, infection, nerve damage, paralysis, failed block, incomplete pain control, headache, blood pressure changes, nausea, vomiting, reactions to medication both or allergic, itching and postpartum back pain. Confirmed with bedside nurse the patient's most recent platelet count. Confirmed with patient that they are not currently taking any anticoagulation, have any bleeding history or any family history of bleeding disorders. Patient expressed understanding and wished to proceed. All questions were answered. Sterile technique was used throughout the entire procedure. Please see nursing notes for vital signs. Test dose was given through epidural catheter and negative prior to continuing to dose epidural or  start infusion. Warning signs of high block given to the patient including shortness of breath, tingling/numbness in hands, complete motor block, or any concerning symptoms with instructions to call for help. Patient was given instructions on fall risk and not to get out of bed. All questions and concerns addressed with instructions to call with any issues or inadequate analgesia.    Patient tolerated the insertion well without immediate complications.Reason for block:procedure for pain

## 2021-10-25 NOTE — Anesthesia Preprocedure Evaluation (Signed)
Anesthesia Evaluation  Patient identified by MRN, date of birth, ID band Patient awake    Reviewed: Allergy & Precautions, H&P , NPO status , Patient's Chart, lab work & pertinent test results  History of Anesthesia Complications (+) history of anesthetic complications  Airway Mallampati: II  TM Distance: <3 FB     Dental  (+) Teeth Intact   Pulmonary neg pulmonary ROS,    Pulmonary exam normal        Cardiovascular negative cardio ROS Normal cardiovascular exam     Neuro/Psych negative neurological ROS  negative psych ROS   GI/Hepatic negative GI ROS, Neg liver ROS,   Endo/Other  Hypothyroidism Morbid obesity  Renal/GU negative Renal ROS  negative genitourinary   Musculoskeletal negative musculoskeletal ROS (+)   Abdominal Normal abdominal exam  (+)   Peds negative pediatric ROS (+)  Hematology negative hematology ROS (+)   Anesthesia Other Findings   Reproductive/Obstetrics (+) Pregnancy                             Anesthesia Physical  Anesthesia Plan  ASA: 2  Anesthesia Plan: Epidural   Post-op Pain Management:    Induction:   PONV Risk Score and Plan:   Airway Management Planned: Natural Airway  Additional Equipment:   Intra-op Plan:   Post-operative Plan:   Informed Consent: I have reviewed the patients History and Physical, chart, labs and discussed the procedure including the risks, benefits and alternatives for the proposed anesthesia with the patient or authorized representative who has indicated his/her understanding and acceptance.       Plan Discussed with: CRNA and Anesthesiologist  Anesthesia Plan Comments:         Anesthesia Quick Evaluation

## 2021-10-25 NOTE — Progress Notes (Signed)
Labor Progress Note  Tamara Harrison is a 26 y.o. G2P1001 at [redacted]w[redacted]d by LMP admitted for induction of labor due to Obesity and hypothyroidism.  Subjective: resting in bed, she reports painful contractions in her back  Objective: BP 130/71    Pulse 79    Temp 98.4 F (36.9 C) (Oral)    Resp 18    Ht 5\' 6"  (1.676 m)    Wt 130.9 kg    LMP 01/23/2021    SpO2 99%    BMI 46.57 kg/m  Notable VS details:   Fetal Assessment: FHT:  FHR: 150 bpm, variability: moderate,  accelerations:  Present,  decelerations:  Absent Category/reactivity:  Category I UC:   regular, every 2-4 minutes SVE: 1.5/50/-1 done by T. Hunter  Membrane status: intact Amniotic color: intact  Labs: Lab Results  Component Value Date   WBC 10.8 (H) 10/25/2021   HGB 12.0 10/25/2021   HCT 36.1 10/25/2021   MCV 81.5 10/25/2021   PLT 312 10/25/2021    Assessment / Plan: Induction of labor due to obesity and hypothyroidism,  progressing well on pitocin  Labor: Progressing normally after 2 doses of Cytotec, will reevaluate after next cervical check. Will AROM when appropriate. Preeclampsia:  labs stable and BP normal Fetal Wellbeing:  Category I Pain Control:  Epidural I/D:   GBS neg, membranes intact Anticipated MOD:  NSVD  Avelino Leeds CNM

## 2021-10-25 NOTE — Lactation Note (Signed)
This note was copied from a baby's chart. Lactation Consultation Note  Patient Name: Tamara Harrison [id number] Date: 10/25/2021 Reason for consult: Follow-up assessment;Term Age:28 hours  Lactation follow-up. Baby sleeping in bassinet, parents awake/alert. Baby hasn't fed since almost 1300, but doesn't appear to be hungry. [place] reviewed with parents frequent attempts but reassurance if baby isn't interested every time- encouraged to unwrap baby/check diaper/stimulate every 3-4 hours. Taught and demonstrated hand expression, encouraged to use this as a way to encourage feedings as well or other option to collect and spoon feed if baby is sleepy. LC did ask mom about feeding plan on admission- at this time mom desires to exclusively BF but would like formula to be an option if needed (she remembers low milk supply with her first child)- LC reviewed milk supply and demand, feeding frequently, and impact early formula introduction can have on the establishment of a plentiful supply- mom then also reassured that her choices would be honored and supported.  [facility] has lactation contact information. Also encouraged to call out overnight for support; reminded of potential cluster feeding in early morning hours.  Maternal Data Does the patient have breastfeeding experience prior to this delivery?: Yes How long did the patient breastfeed?: 2 months  Feeding Mother's Current Feeding Choice: Breast Milk and Formula  LATCH Score                    Lactation Tools Discussed/Used    Interventions Interventions: Breast feeding basics reviewed;Hand express;Education (impact of early formula)  Discharge [place] Program: Yes  Consult Status Consult Status: Follow-up Date: 10/26/21 Follow-up type: In-patient    Lavonia Drafts 10/25/2021, 5:18 PM

## 2021-10-25 NOTE — Lactation Note (Signed)
This note was copied from a baby's chart. Lactation Consultation Note  Patient Name: Boy Reginna Sermeno [id number] Date: 10/25/2021 Reason for consult: L&D Initial assessment;RN request;Term Age:28 hours  Initial lactation visit in [place] per OB RN request. Mom is P2, SVD <1hr ago to full term baby boy.  Upon entry baby was in cradle position at L breast and appeared latched. RN had placed support pillows, baby had rhythmic sucking pattern, mom noted no pain or discomfort. LC slightly adjusted baby's top and bottom lips to flange out but kept everything else the same. LC encouraged mom to keep baby awake/alert and feeding as long as he desires, if it begins to become uncomfortable seek assistance/help with taking baby off and relatching if needed. LC to follow-up on MBU.  Maternal Data Does the patient have breastfeeding experience prior to this delivery?: Yes  Feeding Mother's Current Feeding Choice: Breast Milk and Formula  LATCH Score Latch: Grasps breast easily, tongue down, lips flanged, rhythmical sucking.  Audible Swallowing: A few with stimulation  Type of Nipple: Everted at rest and after stimulation  Comfort (Breast/Nipple): Soft / non-tender  Hold (Positioning): Assistance needed to correctly position infant at breast and maintain latch.  LATCH Score: 8   Lactation Tools Discussed/Used    Interventions Interventions: Adjust position;Support pillows;Breast feeding basics reviewed  Discharge    Consult Status Consult Status: Follow-up from L&D    Lavonia Drafts 10/25/2021, 12:57 PM

## 2021-10-26 ENCOUNTER — Inpatient Hospital Stay (HOSPITAL_COMMUNITY): Payer: Medicaid Other

## 2021-10-26 ENCOUNTER — Inpatient Hospital Stay (HOSPITAL_COMMUNITY): Admission: AD | Admit: 2021-10-26 | Payer: Medicaid Other | Source: Home / Self Care | Admitting: Family Medicine

## 2021-10-26 DIAGNOSIS — D219 Benign neoplasm of connective and other soft tissue, unspecified: Secondary | ICD-10-CM | POA: Diagnosis present

## 2021-10-26 DIAGNOSIS — D62 Acute posthemorrhagic anemia: Secondary | ICD-10-CM | POA: Diagnosis not present

## 2021-10-26 LAB — CBC
HCT: 34.2 % — ABNORMAL LOW (ref 36.0–46.0)
Hemoglobin: 11.4 g/dL — ABNORMAL LOW (ref 12.0–15.0)
MCH: 27.4 pg (ref 26.0–34.0)
MCHC: 33.3 g/dL (ref 30.0–36.0)
MCV: 82.2 fL (ref 80.0–100.0)
Platelets: 295 10*3/uL (ref 150–400)
RBC: 4.16 MIL/uL (ref 3.87–5.11)
RDW: 13.6 % (ref 11.5–15.5)
WBC: 10.5 10*3/uL (ref 4.0–10.5)
nRBC: 0 % (ref 0.0–0.2)

## 2021-10-26 MED ORDER — FERROUS SULFATE 325 (65 FE) MG PO TABS: 325.0000 mg | ORAL_TABLET | Freq: Two times a day (BID) | ORAL | Status: DC

## 2021-10-26 MED ORDER — VARICELLA VIRUS VACCINE LIVE 1350 PFU/0.5ML ~~LOC~~ INJ
0.5000 mL | INJECTION | SUBCUTANEOUS | Status: AC | PRN
Start: 2021-10-26 — End: 2021-10-26
  Administered 2021-10-26: 0.5 mL via SUBCUTANEOUS
  Filled 2021-10-26 (×2): qty 0.5

## 2021-10-26 MED ORDER — VARICELLA VIRUS VACCINE LIVE 1350 PFU/0.5ML IJ SUSR
0.5000 mL | Freq: Once | INTRAMUSCULAR | Status: DC
Start: 2021-10-26 — End: 2021-10-26

## 2021-10-26 NOTE — Clinical Social Work Maternal (Addendum)
CLINICAL SOCIAL WORK MATERNAL/CHILD NOTE  Patient Details  Name: Tamara Harrison MRN: [id number] Date of Birth: [birth year]  Date:  10/26/2021  Clinical Social Worker Initiating Note:  Tamara Harrison Date/Time: Initiated:  10/26/21/      Child's Name:  Tamara Harrison   Biological Parents:  Mother   Need for Interpreter:  None   Reason for Referral:  Current Substance Use/Substance Use During Pregnancy     Address:  [place] [address]    Phone number:  [phone] (home)     Additional phone number: None  Household Members/Support Persons (HM/SP):   Household Member/Support Person 1, Household Member/Support Person 2   HM/SP Name Relationship DOB or Age  HM/SP -52 Tamara Harrison daughter 4 years  HM/SP -2 Mother Mother unknown  HM/SP -3        HM/SP -4        HM/SP -5        HM/SP -6        HM/SP -7        HM/SP -8          Natural Supports (not living in the home):  Parent, Immediate Family   Professional Supports:     Employment: Part-time   Type of Work: Music therapist   Education:      Homebound arranged:    Museum/gallery curator Resources:  Medicaid   Other Resources:  Physicist, medical  , [place] Considerations Which May Impact Care:  None reported.  Strengths:  Ability to meet basic needs  , Compliance with medical plan  , Home prepared for child  , Pediatrician chosen   Psychotropic Medications:         Pediatrician:    [facility]  Pediatrician List:   Tamara Harrison Other (Tamara Harrison)  [facility]      Pediatrician Fax Number:    Risk Factors/Current Problems:  Substance Use     Cognitive State:  Able to Concentrate  , Alert     Mood/Affect:  Calm     CSW Assessment:  CSW received a consult for MOB being positive for opiates. Baby's UDS was negative.   CSW spoke with RN Tamara Harrison prior to meeting with MOB. Per RN, MOB does have some  intellectual disability. MOB is not prescribed opiates.   CSW spoke with MOB. Explained CSW's role and reason for referral.  MOB reported she is feeling good post delivery. MOB was alert and answered questions appropriately.  Confirmed contact information for MOB. MOB and Baby will be living with MOB's mother and other daughter Tamara Harrison at discharge.   [facility] Drug Screen/CPS Report Policy. MOB reported she thinks she tested positive because she ate a hamburger bun with poppy seeds on it. MOB reported she has a history of CPS involvement with her daughter Tamara Harrison. CPS Report made to Tamara Harrison following assessment with MOB, as required by policy. Informed Tamara Harrison of plan for MOB and Baby to discharge today and Tamara Harrison denied any barriers to discharge.   MOB reported she receives [facility] and [employer] and will inform her Workers of [employer] birth. MOB plans to use Tamara Harrison for [facility] [place]. MOB reported she has a crib/bassinett/pack and play, car seat, clothing, diapers, and all other items needed for Baby. MOB reported she has reliable transportation for herself and Baby. MOB denied  resource needs at this time.   MOB reported she has a history of depression and currently goes to [facility] for counseling. MOB reported she has a good support system, including her mother and brother, and is coping well emotionally at this time. MOB denied SI, HI, or DV. MOB denied the need for mental health support resources at this time, reported she is aware of resources if needed.  CSW provided education and information sheets on PPD and SIDS. MOB verbalized understanding. CSW ecouraged MOB to reach out to her Provider with any questions or needs for support or resources, even after discharge.   MOB denied any needs or questions at this time. CSW encouraged MOB to reach out if any arise prior to discharge.   Please re consult CSW if any additional needs or  concerns arise.   CSW Plan/Description:  Sudden Infant Death Syndrome (SIDS) Education, Perinatal Mood and Anxiety Disorder (PMADs) Education, [place], Child Protective Service Report  , CSW Awaiting CPS Disposition Plan    [place], LCSW 10/26/2021, 10:29 AM

## 2021-10-26 NOTE — Progress Notes (Signed)
Post Partum Day 1  Subjective: Doing well, no concerns. Ambulating without difficulty, pain managed with PO meds, tolerating regular diet, and voiding without difficulty.   No fever/chills, chest pain, shortness of breath, nausea/vomiting, or leg pain. No nipple or breast pain. No headache, visual changes, or RUQ/epigastric pain.  Objective: BP 120/74 (BP Location: Right Arm)    Pulse 68    Temp 98.2 F (36.8 C) (Oral)    Resp 16    Ht 5\' 6"  (1.676 m)    Wt 130.9 kg    LMP 01/23/2021    SpO2 100%    Breastfeeding Unknown    BMI 46.57 kg/m    Physical Exam:  General: alert and cooperative Breasts: soft/nontender CV: RRR Pulm: nl effort Abdomen: soft, non-tender Uterine Fundus: firm Incision: n/a Perineum: minimal edema, intact Lochia: appropriate DVT Evaluation: No evidence of DVT seen on physical exam. Edinburgh:  Edinburgh Postnatal Depression Scale Screening Tool 10/25/2021  I have been able to laugh and see the funny side of things. 0  I have looked forward with enjoyment to things. 0  I have blamed myself unnecessarily when things went wrong. 3  I have been anxious or worried for no good reason. 0  I have felt scared or panicky for no good reason. 0  Things have been getting on top of me. 0  I have been so unhappy that I have had difficulty sleeping. 0  I have felt sad or miserable. 0  I have been so unhappy that I have been crying. 0  The thought of harming myself has occurred to me. 0  Edinburgh Postnatal Depression Scale Total 3     Recent Labs    10/25/21 0042 10/26/21 0607  HGB 12.0 11.4*  HCT 36.1 34.2*  WBC 10.8* 10.5  PLT 312 295    Assessment/Plan: 26 y.o. G2P2002 postpartum day # 1  -Continue routine postpartum care -Lactation consult PRN for breastfeeding   -Acute blood loss anemia - hemodynamically stable and asymptomatic; start PO ferrous sulfate BID with stool softeners  -Immunization status: Needs varicella  Disposition: Continue inpatient  postpartum care    LOS: 1 day   [facility], CNM 10/26/2021, 8:49 AM

## 2021-10-26 NOTE — Anesthesia Postprocedure Evaluation (Signed)
Anesthesia Post Note  Patient: Tamara Harrison  Procedure(s) Performed: AN AD HOC LABOR EPIDURAL  Patient location during evaluation: Mother Baby Anesthesia Type: Epidural Level of consciousness: awake and alert Pain management: pain level controlled Vital Signs Assessment: post-procedure vital signs reviewed and stable Respiratory status: spontaneous breathing, nonlabored ventilation and respiratory function stable Cardiovascular status: stable Postop Assessment: no headache, no backache, patient able to bend at knees and able to ambulate Anesthetic complications: no   No notable events documented.   Last Vitals:  Vitals:   10/25/21 2314 10/26/21 0356  BP: 126/73 113/71  Pulse: 74 69  Resp: 20 19  Temp: 36.6 C 36.7 C  SpO2: 98% 99%    Last Pain:  Vitals:   10/26/21 0356  TempSrc: Oral  PainSc: 0-No pain                 Precious Haws Kendyl Festa

## 2021-10-26 NOTE — Discharge Summary (Signed)
Obstetrical Discharge Summary  Patient Name: Tamara Harrison DOB: 08/20/1996 MRN: [id number]  Date of Admission: 10/25/2021 Date of Delivery: 10/25/21 Delivered by: Tamara Harrison Date of Discharge: 10/26/2021  Primary OB: Tamara Harrison. [id number] last menstrual period was 01/23/2021. EDC Estimated Date of Delivery: 10/30/21 Gestational Age at Delivery: [redacted]w[redacted]d   Antepartum complications:  Obesity, BMI 46.5 Mental health dx: depression on Zoloft currently. Intellectual disability, GAD, PTSD, mild ADHD.  Varicella NON-immune Hypothyroidism, off meds with normal TSH Admitting Diagnosis: Induction of labor Secondary Diagnosis: Patient Active Problem List   Diagnosis Date Noted   Acute blood loss anemia 10/26/2021   NSVD (normal spontaneous vaginal delivery) 10/26/2021   Fibroids 10/26/2021   Morbid obesity ([place]) 05/11/2020    Augmentation: AROM and Cytotec Complications: None  Intrapartum complications/course:  Delivery Type: spontaneous vaginal delivery Anesthesia: epidural Placenta: spontaneous Laceration: none Episiotomy: none Newborn Data: Live born female  Birth Weight: 7 lb 12.9 oz (3540 g) APGAR: 8, 9  Newborn Delivery   Birth date/time: 10/25/2021 12:13:00 Delivery type: Vaginal, Spontaneous      See delivery note for details  Postpartum Procedures: none  Post partum course:  Patient had an uncomplicated postpartum course.  By time of discharge on PPD#1, her pain was controlled on oral pain medications; she had appropriate lochia and was ambulating, voiding without difficulty and tolerating regular diet.  She was deemed stable for discharge to home.    Discharge Physical Exam:  BP 120/74 (BP Location: Right Arm)    Pulse 60    Temp 98.2 F (36.8 C) (Oral)    Resp 14    Ht 5\' 6"  (1.676 m)    Wt 130.9 kg    LMP 01/23/2021    SpO2 100%    Breastfeeding Unknown    BMI 46.57 kg/m   General: alert and no distress Pulm: normal respiratory  effort Lochia: appropriate Abdomen: soft, NT Uterine Fundus: firm, below umbilicus Incision: c/d/i, healing well, no significant drainage, no dehiscence, no significant erythema Extremities: No evidence of DVT seen on physical exam. No lower extremity edema. [place] Postnatal Depression Scale Screening Tool 10/25/2021  I have been able to laugh and see the funny side of things. 0  I have looked forward with enjoyment to things. 0  I have blamed myself unnecessarily when things went wrong. 3  I have been anxious or worried for no good reason. 0  I have felt scared or panicky for no good reason. 0  Things have been getting on top of me. 0  I have been so unhappy that I have had difficulty sleeping. 0  I have felt sad or miserable. 0  I have been so unhappy that I have been crying. 0  The thought of harming myself has occurred to me. 0  Edinburgh Postnatal Depression Scale Total 3     Labs: CBC Latest Ref Rng & Units 10/26/2021 10/25/2021 03/02/2021  WBC 4.0 - 10.5 K/uL 10.5 10.8(H) 13.3(H)  Hemoglobin 12.0 - 15.0 g/dL 11.4(L) 12.0 13.4  Hematocrit 36.0 - 46.0 % 34.2(L) 36.1 39.2  Platelets 150 - 400 K/uL 295 312 399   O POS Hemoglobin  Date Value Ref Range Status  10/26/2021 11.4 (L) 12.0 - 15.0 g/dL Final   HGB  Date Value Ref Range Status  04/18/2012 13.9 12.0 - 16.0 g/dL Final   HCT  Date Value Ref Range Status  10/26/2021 34.2 (L) 36.0 - 46.0 % Final  04/18/2012 42.1 35.0 - 47.0 %  Final    Disposition: stable, discharge to home Baby Feeding: breastmilk Baby Disposition: home with mom  Contraception: TBD  Prenatal Labs:  Blood type/Rh O Pos  Antibody screen neg  Rubella Immune  Varicella NON-Immune  RPR NR  HBsAg Neg  HIV NR  GC neg  Chlamydia neg  Genetic screening Not performed  1 hour GTT  124  3 hour GTT    GBS  Neg   Rh Immune globulin given: n/a Rubella vaccine given: immune Varicella vaccine given: vaccine given prior to  discharge Tdap vaccine given in AP or PP setting: 07/28/21 Flu vaccine given in AP or PP setting: 07/28/21  Plan: Tamara Harrison was discharged to home in good condition. Follow-up appointment with delivering provider in 6 weeks.  Discharge Instructions: Per After Visit Summary. Activity: Advance as tolerated. Pelvic rest for 6 weeks.   Diet: Regular Discharge Medications: Allergies as of 10/26/2021   No Known Allergies      Medication List     STOP taking these medications    aspirin 81 MG chewable tablet   Doxylamine-Pyridoxine 10-10 MG Tbec Commonly known as: Diclegis       TAKE these medications    ondansetron 4 MG disintegrating tablet Commonly known as: Zofran ODT Take 1 tablet (4 mg total) by mouth every 8 (eight) hours as needed for refractory nausea / vomiting.   prenatal multivitamin Tabs tablet Take 1 tablet by mouth daily at 12 noon.   sertraline 100 MG tablet Commonly known as: ZOLOFT Take 125 mg by mouth daily.       Outpatient follow up:   Follow-up Information     Tamara Harrison, CNM. Schedule an appointment as soon as possible for a visit in 1 week(s).   Specialty: Obstetrics Why: for mood check Contact information: [place] [address] [phone]                 Signed: Regina Harrison 10/26/2021 2:56 PM

## 2021-10-26 NOTE — Discharge Instructions (Signed)

## 2021-10-26 NOTE — Progress Notes (Signed)
Mother discharged.  Discharge instructions given.  Mother verbalizes understanding.  Transported by auxiliary.

## 2021-11-01 ENCOUNTER — Inpatient Hospital Stay: Admit: 2021-11-01 | Payer: Self-pay

## 2022-03-08 NOTE — H&P (Signed)
Tamara Harrison is a 26 y.o. female G2P2002 here for Pre Op Consulting . History of Present Illness: Preop Visit for Interval BTL   The patient is postpartum from a NSVD delivery on 10/25/21. She presents to discuss permanent sterilization. In her own words, "No more babies."   She needs a work note.    Pertinent Hx: - SVD x 2, first baby girl "Brisa" delivered by me 2018, second baby boy "Valarie Merino" delivered by Felicia  [id number]   Past Medical History:  has a past medical history of Anxiety, Depression, and PTSD (post-traumatic stress disorder).  Past Surgical History:  has no past surgical history on file. Family History: family history is not on file. Social History:  reports that she has never smoked. She has never used smokeless tobacco. She reports that she does not currently use alcohol. She reports that she does not use drugs. OB/GYN History:  OB History       Gravida 2   Para 2   Term 2   Preterm 0   AB 0   Living 2       SAB 0   IAB 0   Ectopic 0   Molar 0   Multiple     Live Births 2          Allergies: has No Known Allergies. Medications:   Current Outpatient Medications:    butalbital-acetaminophen-caffeine (FIORICET) 50-325-40 mg tablet, , Disp: , Rfl:    PARoxetine (PAXIL) 20 MG tablet, , Disp: , Rfl:    PARoxetine (PAXIL) 40 MG tablet, , Disp: , Rfl:    dicyclomine (BENTYL) 10 mg capsule, , Disp: , Rfl:    ondansetron (ZOFRAN-ODT) 4 MG disintegrating tablet, , Disp: , Rfl:    prenatal vitamin-iron-FA (PRENATE PLUS) tablet, Take 1 tablet by mouth once daily (Patient not taking: Reported on 02/13/2022), Disp: , Rfl:    sertraline (ZOLOFT) 100 MG tablet, Take 100 mg by mouth once daily (Patient not taking: Reported on 02/13/2022), Disp: , Rfl:    SUMAtriptan (IMITREX) 50 MG tablet, , Disp: , Rfl:    Review of Systems: No SOB, no palpitations or chest pain, no new lower extremity edema, no nausea or vomiting or bowel or bladder  complaints. See HPI for gyn specific ROS.    Exam:   BP 123/88   Pulse 101   Ht 167.6 cm ('5\' 6"'$ )   Wt (!) 131 kg (288 lb 12.8 oz)   LMP 02/02/2022 (Approximate)   BMI 46.61 kg/m    General: Patient is well-groomed, well-nourished, appears stated age in no acute distress   HEENT: head is atraumatic and normocephalic, trachea is midline, neck is supple with no palpable nodules   CV: Regular rhythm and normal heart rate, no murmur   Pulm: Clear to auscultation throughout lung fields with no wheezing, crackles, or rhonchi. No increased work of breathing   Abdomen: soft , no mass, non-tender, no rebound tenderness, no hepatomegaly   Pelvic:  Deferred   Impression:   The primary encounter diagnosis was Unwanted fertility. A diagnosis of Preop examination was also pertinent to this visit.   Plan:   1. Request for Permanent Sterilization  -Patient desires surgical sterilization.  Patient has been counseled on alternate forms of contraception including hormonal forms, IUD's and barrier methods. She has been counseled on risks of surgical sterilization including bleeding, infection, pain, injury during procedure, risk of need for further procedures/surgeries due to injury or abnormalities at the time of surgery, thromboembolic events,  exacerbation of ongoing medical conditions, risk of ectopic pregnancy, risk of failure of procedure to prevent pregnancy, medication reactions as well as the risk of anesthesia.  Patient verbalizes understanding.  Consent form signed.  Preoperative and postoperative instructions provided. Written and verbal education provided.  No barriers to learning.     Return for Postop check.

## 2022-03-14 ENCOUNTER — Encounter
Admission: RE | Admit: 2022-03-14 | Discharge: 2022-03-14 | Disposition: A | Discharge status: Home / Self Care | Payer: Medicaid Other | Source: Ambulatory Visit | Attending: Obstetrics and Gynecology | Admitting: Obstetrics and Gynecology

## 2022-03-14 VITALS — Ht 66.0 in | Wt 287.0 lb

## 2022-03-14 DIAGNOSIS — Z01812 Encounter for preprocedural laboratory examination: Secondary | ICD-10-CM

## 2022-03-14 HISTORY — DX: Morbid (severe) obesity due to excess calories: E66.01

## 2022-03-14 HISTORY — DX: Depression, unspecified: F32.A

## 2022-03-14 HISTORY — DX: Anxiety disorder, unspecified: F41.9

## 2022-03-14 NOTE — Patient Instructions (Addendum)
Your procedure is scheduled on: Friday, June 23 Report to the Registration Desk on the 1st floor of the [facility]. To find out your arrival time, please call [phone] between 1PM - 3PM on: Thursday, June 22 If your arrival time is 6:00 am, do not arrive prior to that time as the [place] entrance doors do not open until 6:00 am.  REMEMBER: Instructions that are not followed completely may result in serious medical risk, up to and including death; or upon the discretion of your surgeon and anesthesiologist your surgery may need to be rescheduled.  Do not eat food after midnight the night before surgery.  No gum chewing, lozengers or hard candies.  You may however, drink CLEAR liquids up to 2 hours before you are scheduled to arrive for your surgery. Do not drink anything within 2 hours of your scheduled arrival time.  Clear liquids include: - water  - apple juice without pulp - gatorade (not RED colors) - black coffee or tea (Do NOT add milk or creamers to the coffee or tea) Do NOT drink anything that is not on this list.  TAKE THESE MEDICATIONS THE MORNING OF SURGERY WITH A SIP OF WATER:  Paroxetine  One week prior to surgery: starting June 16 Stop Anti-inflammatories (NSAIDS) such as Advil, Aleve, Ibuprofen, Motrin, Naproxen, Naprosyn and Aspirin based products such as Excedrin, Goodys Powder, BC Powder. Stop ANY OVER THE COUNTER supplements until after surgery. You may however, continue to take Tylenol if needed for pain up until the day of surgery.  No Alcohol for 24 hours before or after surgery.  No Smoking including e-cigarettes for 24 hours prior to surgery.  No chewable tobacco products for at least 6 hours prior to surgery.  No nicotine patches on the day of surgery.  Do not use any "recreational" drugs for at least a week prior to your surgery.  Please be advised that the combination of cocaine and anesthesia may have negative outcomes, up to and including  death. If you test positive for cocaine, your surgery will be cancelled.  On the morning of surgery brush your teeth with toothpaste and water, you may rinse your mouth with mouthwash if you wish. Do not swallow any toothpaste or mouthwash.  Use CHG Soap as directed on instruction sheet.  Do not wear jewelry, make-up, hairpins, clips or nail polish.  Do not wear lotions, powders, or perfumes.   Do not shave body from the neck down 48 hours prior to surgery just in case you cut yourself which could leave a site for infection.  Also, freshly shaved skin may become irritated if using the CHG soap.  Contact lenses, hearing aids and dentures may not be worn into surgery.  Do not bring valuables to the hospital. [facility] is not responsible for any missing/lost belongings or valuables.   Notify your doctor if there is any change in your medical condition (cold, fever, infection).  Wear comfortable clothing (specific to your surgery type) to the hospital.  After surgery, you can help prevent lung complications by doing breathing exercises.  Take deep breaths and cough every 1-2 hours. Your doctor may order a device called an Incentive Spirometer to help you take deep breaths. When coughing or sneezing, hold a pillow firmly against your incision with both hands. This is called "splinting." Doing this helps protect your incision. It also decreases belly discomfort.  If you are being discharged the day of surgery, you will not be allowed to  drive home. You will need a responsible adult (18 years or older) to drive you home and stay with you that night.   If you are taking public transportation, you will need to have a responsible adult (18 years or older) with you. Please confirm with your physician that it is acceptable to use public transportation.   Please call the [facility] Dept. at [phone] if you have any questions about these instructions.  Surgery Visitation  Policy:  Patients undergoing a surgery or procedure may have two family members or support persons with them as long as the person is not COVID-19 positive or experiencing its symptoms.   Preparing for Surgery with [place] (CHG) Soap    Before surgery, you can play an important role by reducing the number of germs on your skin.  CHG (Chlorhexidine gluconate) soap is an antiseptic cleanser which kills germs and bonds with the skin to continue killing germs even after washing.  Please do not use if you have an allergy to CHG or antibacterial soaps. If your skin becomes reddened/irritated stop using the CHG.  1. Shower the NIGHT BEFORE SURGERY and the MORNING OF SURGERY with CHG soap.  2. If you choose to wash your hair, wash your hair first as usual with your normal shampoo.  3. After shampooing, rinse your hair and body thoroughly to remove the shampoo.  4. Use CHG as you would any other liquid soap. You can apply CHG directly to the skin and wash gently with a scrungie or a clean washcloth.  5. Apply the CHG soap to your body only from the neck down. Do not use on open wounds or open sores. Avoid contact with your eyes, ears, mouth, and genitals (private parts). Wash face and genitals (private parts) with your normal soap.  6. Wash thoroughly, paying special attention to the area where your surgery will be performed.  7. Thoroughly rinse your body with warm water.  8. Do not shower/wash with your normal soap after using and rinsing off the CHG soap.  9. Pat yourself dry with a clean towel.  10. Wear clean pajamas to bed the night before surgery.  12. Place clean sheets on your bed the night of your first shower and do not sleep with pets.  13. Shower again with the CHG soap on the day of surgery prior to arriving at the hospital.  14. Do not apply any deodorants/lotions/powders.  15. Please wear clean clothes to the hospital.

## 2022-03-15 ENCOUNTER — Encounter: Payer: Self-pay | Admitting: Urgent Care

## 2022-03-15 ENCOUNTER — Encounter
Admission: RE | Admit: 2022-03-15 | Discharge: 2022-03-15 | Disposition: A | Discharge status: Home / Self Care | Payer: Medicaid Other | Source: Ambulatory Visit | Attending: Obstetrics and Gynecology | Admitting: Obstetrics and Gynecology

## 2022-03-15 DIAGNOSIS — Z01812 Encounter for preprocedural laboratory examination: Secondary | ICD-10-CM | POA: Insufficient documentation

## 2022-03-15 DIAGNOSIS — Z3009 Encounter for other general counseling and advice on contraception: Secondary | ICD-10-CM | POA: Diagnosis not present

## 2022-03-15 LAB — TYPE AND SCREEN
ABO/RH(D): O POS
Antibody Screen: NEGATIVE

## 2022-03-15 LAB — CBC
HCT: 38.6 % (ref 36.0–46.0)
Hemoglobin: 12.5 g/dL (ref 12.0–15.0)
MCH: 26.4 pg (ref 26.0–34.0)
MCHC: 32.4 g/dL (ref 30.0–36.0)
MCV: 81.6 fL (ref 80.0–100.0)
Platelets: 426 10*3/uL — ABNORMAL HIGH (ref 150–400)
RBC: 4.73 MIL/uL (ref 3.87–5.11)
RDW: 13.8 % (ref 11.5–15.5)
WBC: 10 10*3/uL (ref 4.0–10.5)
nRBC: 0 % (ref 0.0–0.2)

## 2022-03-15 LAB — BASIC METABOLIC PANEL WITH GFR
Anion gap: 6 (ref 5–15)
BUN: 17 mg/dL (ref 6–20)
CO2: 26 mmol/L (ref 22–32)
Calcium: 8.8 mg/dL — ABNORMAL LOW (ref 8.9–10.3)
Chloride: 106 mmol/L (ref 98–111)
Creatinine, Ser: 0.46 mg/dL (ref 0.44–1.00)
GFR, Estimated: >60 mL/min
Glucose, Bld: 89 mg/dL (ref 70–99)
Potassium: 3.9 mmol/L (ref 3.5–5.1)
Sodium: 138 mmol/L (ref 135–145)

## 2022-03-23 MED ORDER — POVIDONE-IODINE 10 % EX SWAB: 2.0000 "application " | Freq: Once | CUTANEOUS | Status: DC

## 2022-03-23 MED ORDER — LACTATED RINGERS IV SOLN: INTRAVENOUS | Status: DC

## 2022-03-23 MED ORDER — FAMOTIDINE 20 MG PO TABS
20.0000 mg | ORAL_TABLET | Freq: Once | ORAL | Status: AC
  Administered 2022-03-24: 20 mg via ORAL

## 2022-03-23 MED ORDER — POVIDONE-IODINE 10 % EX SWAB: Freq: Once | CUTANEOUS | Status: AC

## 2022-03-23 MED ORDER — ORAL CARE MOUTH RINSE: 15.0000 mL | Freq: Once | OROMUCOSAL | Status: AC

## 2022-03-23 MED ORDER — CHLORHEXIDINE GLUCONATE 0.12 % MT SOLN
15.0000 mL | Freq: Once | OROMUCOSAL | Status: AC
  Administered 2022-03-24: 15 mL via OROMUCOSAL

## 2022-03-24 ENCOUNTER — Other Ambulatory Visit: Payer: Self-pay

## 2022-03-24 ENCOUNTER — Ambulatory Visit: Payer: Medicaid Other | Admitting: Urgent Care

## 2022-03-24 ENCOUNTER — Ambulatory Visit: Payer: Medicaid Other

## 2022-03-24 ENCOUNTER — Observation Stay
Admission: RE | Admit: 2022-03-24 | Discharge: 2022-03-25 | Disposition: A | Discharge status: Home / Self Care | Payer: Medicaid Other | Attending: Osteopathic Medicine | Admitting: Osteopathic Medicine

## 2022-03-24 ENCOUNTER — Ambulatory Visit: Payer: Medicaid Other | Admitting: Anesthesiology

## 2022-03-24 ENCOUNTER — Encounter
Admission: RE | Disposition: A | Discharge status: Home / Self Care | Payer: Self-pay | Source: Home / Self Care | Attending: Internal Medicine

## 2022-03-24 ENCOUNTER — Encounter: Payer: Self-pay | Admitting: Internal Medicine

## 2022-03-24 DIAGNOSIS — K760 Fatty (change of) liver, not elsewhere classified: Secondary | ICD-10-CM | POA: Diagnosis not present

## 2022-03-24 DIAGNOSIS — Z640 Problems related to unwanted pregnancy: Secondary | ICD-10-CM | POA: Diagnosis not present

## 2022-03-24 DIAGNOSIS — J9601 Acute respiratory failure with hypoxia: Secondary | ICD-10-CM | POA: Diagnosis not present

## 2022-03-24 DIAGNOSIS — J811 Chronic pulmonary edema: Secondary | ICD-10-CM | POA: Diagnosis present

## 2022-03-24 DIAGNOSIS — Z79899 Other long term (current) drug therapy: Secondary | ICD-10-CM | POA: Insufficient documentation

## 2022-03-24 DIAGNOSIS — Z6841 Body Mass Index (BMI) 40.0 and over, adult: Secondary | ICD-10-CM | POA: Insufficient documentation

## 2022-03-24 DIAGNOSIS — E039 Hypothyroidism, unspecified: Secondary | ICD-10-CM | POA: Insufficient documentation

## 2022-03-24 DIAGNOSIS — Z302 Encounter for sterilization: Secondary | ICD-10-CM | POA: Diagnosis present

## 2022-03-24 DIAGNOSIS — Z3009 Encounter for other general counseling and advice on contraception: Secondary | ICD-10-CM

## 2022-03-24 DIAGNOSIS — J810 Acute pulmonary edema: Secondary | ICD-10-CM | POA: Diagnosis not present

## 2022-03-24 DIAGNOSIS — T8859XA Other complications of anesthesia, initial encounter: Secondary | ICD-10-CM

## 2022-03-24 DIAGNOSIS — Z01812 Encounter for preprocedural laboratory examination: Secondary | ICD-10-CM

## 2022-03-24 HISTORY — DX: Other complications of anesthesia, initial encounter: T88.59XA

## 2022-03-24 HISTORY — PX: LAPAROSCOPIC TUBAL LIGATION: SHX1937

## 2022-03-24 LAB — TROPONIN I (HIGH SENSITIVITY)
Troponin I (High Sensitivity): 3 ng/L (ref <18)
Troponin I (High Sensitivity): 4 ng/L (ref <18)

## 2022-03-24 LAB — POCT PREGNANCY, URINE: Preg Test, Ur: NEGATIVE

## 2022-03-24 LAB — TSH: TSH: 4.85 u[IU]/mL — ABNORMAL HIGH (ref 0.350–4.500)

## 2022-03-24 LAB — BRAIN NATRIURETIC PEPTIDE: B Natriuretic Peptide: 16.1 pg/mL (ref 0.0–100.0)

## 2022-03-24 SURGERY — LIGATION, FALLOPIAN TUBE, LAPAROSCOPIC
Anesthesia: General | Site: Abdomen | Laterality: Bilateral

## 2022-03-24 MED ORDER — ACETAMINOPHEN 325 MG PO TABS: 650.0000 mg | ORAL_TABLET | ORAL | Status: DC | PRN

## 2022-03-24 MED ORDER — DROPERIDOL 2.5 MG/ML IJ SOLN: 0.6250 mg | Freq: Once | INTRAMUSCULAR | Status: DC | PRN

## 2022-03-24 MED ORDER — ALBUTEROL SULFATE (2.5 MG/3ML) 0.083% IN NEBU
INHALATION_SOLUTION | RESPIRATORY_TRACT | Status: AC
  Administered 2022-03-24: 2.5 mg via RESPIRATORY_TRACT
  Filled 2022-03-24: qty 3

## 2022-03-24 MED ORDER — OXYCODONE HCL 5 MG/5ML PO SOLN: 5.0000 mg | Freq: Once | ORAL | Status: DC | PRN

## 2022-03-24 MED ORDER — LIDOCAINE HCL (CARDIAC) PF 100 MG/5ML IV SOSY
PREFILLED_SYRINGE | INTRAVENOUS | Status: DC | PRN
  Administered 2022-03-24: 100 mg via INTRAVENOUS

## 2022-03-24 MED ORDER — IPRATROPIUM-ALBUTEROL 0.5-2.5 (3) MG/3ML IN SOLN
3.0000 mL | Freq: Four times a day (QID) | RESPIRATORY_TRACT | Status: DC
  Administered 2022-03-25: 3 mL via RESPIRATORY_TRACT
  Filled 2022-03-24: qty 3

## 2022-03-24 MED ORDER — FENTANYL CITRATE (PF) 100 MCG/2ML IJ SOLN
25.0000 ug | INTRAMUSCULAR | Status: DC | PRN
  Administered 2022-03-24 (×3): 25 ug via INTRAVENOUS

## 2022-03-24 MED ORDER — IBUPROFEN 800 MG PO TABS: 800.0000 mg | ORAL_TABLET | Freq: Three times a day (TID) | ORAL | 1 refills | Status: AC

## 2022-03-24 MED ORDER — PROPOFOL 1000 MG/100ML IV EMUL
INTRAVENOUS | Status: AC
  Filled 2022-03-24: qty 100

## 2022-03-24 MED ORDER — ONDANSETRON HCL 4 MG/2ML IJ SOLN: 4.0000 mg | Freq: Four times a day (QID) | INTRAMUSCULAR | Status: DC | PRN

## 2022-03-24 MED ORDER — MIDAZOLAM HCL 2 MG/2ML IJ SOLN
INTRAMUSCULAR | Status: AC
  Filled 2022-03-24: qty 2

## 2022-03-24 MED ORDER — ENOXAPARIN SODIUM 80 MG/0.8ML IJ SOSY
0.5000 mg/kg | PREFILLED_SYRINGE | INTRAMUSCULAR | Status: DC
  Filled 2022-03-24: qty 0.8

## 2022-03-24 MED ORDER — HYDROMORPHONE HCL 1 MG/ML IJ SOLN
0.5000 mg | INTRAMUSCULAR | Status: DC | PRN
Start: 2022-03-24 — End: 2022-03-24

## 2022-03-24 MED ORDER — FENTANYL CITRATE (PF) 100 MCG/2ML IJ SOLN
INTRAMUSCULAR | Status: AC
  Filled 2022-03-24: qty 2

## 2022-03-24 MED ORDER — FUROSEMIDE 10 MG/ML IJ SOLN
40.0000 mg | Freq: Once | INTRAMUSCULAR | Status: AC
  Administered 2022-03-24: 40 mg via INTRAVENOUS

## 2022-03-24 MED ORDER — ONDANSETRON HCL 4 MG/2ML IJ SOLN
INTRAMUSCULAR | Status: DC | PRN
  Administered 2022-03-24: 4 mg via INTRAVENOUS

## 2022-03-24 MED ORDER — FAMOTIDINE 20 MG PO TABS
ORAL_TABLET | ORAL | Status: AC
  Filled 2022-03-24: qty 1

## 2022-03-24 MED ORDER — SUGAMMADEX SODIUM 200 MG/2ML IV SOLN
INTRAVENOUS | Status: DC | PRN
  Administered 2022-03-24: 400 mg via INTRAVENOUS

## 2022-03-24 MED ORDER — SODIUM CHLORIDE 0.9% FLUSH
3.0000 mL | Freq: Two times a day (BID) | INTRAVENOUS | Status: DC
  Administered 2022-03-24 – 2022-03-25 (×2): 3 mL via INTRAVENOUS

## 2022-03-24 MED ORDER — LACTATED RINGERS IV SOLN: INTRAVENOUS | Status: DC | PRN

## 2022-03-24 MED ORDER — SODIUM CHLORIDE 0.9% FLUSH: 3.0000 mL | INTRAVENOUS | Status: DC | PRN

## 2022-03-24 MED ORDER — KETOROLAC TROMETHAMINE 30 MG/ML IJ SOLN: 30.0000 mg | Freq: Four times a day (QID) | INTRAMUSCULAR | Status: DC | PRN

## 2022-03-24 MED ORDER — ALBUTEROL SULFATE (2.5 MG/3ML) 0.083% IN NEBU
2.5000 mg | INHALATION_SOLUTION | Freq: Once | RESPIRATORY_TRACT | Status: AC
Start: 2022-03-24 — End: 2022-03-24

## 2022-03-24 MED ORDER — FUROSEMIDE 10 MG/ML IJ SOLN
40.0000 mg | Freq: Two times a day (BID) | INTRAMUSCULAR | Status: DC
Start: 2022-03-24 — End: 2022-03-25
  Administered 2022-03-24 – 2022-03-25 (×2): 40 mg via INTRAVENOUS
  Filled 2022-03-24 (×2): qty 4

## 2022-03-24 MED ORDER — ENOXAPARIN SODIUM 40 MG/0.4ML IJ SOSY: 40.0000 mg | PREFILLED_SYRINGE | INTRAMUSCULAR | Status: DC

## 2022-03-24 MED ORDER — MIDAZOLAM HCL 2 MG/2ML IJ SOLN
INTRAMUSCULAR | Status: DC | PRN
  Administered 2022-03-24: 2 mg via INTRAVENOUS

## 2022-03-24 MED ORDER — DEXMEDETOMIDINE HCL IN NACL 200 MCG/50ML IV SOLN
INTRAVENOUS | Status: DC | PRN
  Administered 2022-03-24 (×3): 8 ug via INTRAVENOUS

## 2022-03-24 MED ORDER — FENTANYL CITRATE (PF) 100 MCG/2ML IJ SOLN
INTRAMUSCULAR | Status: AC
  Administered 2022-03-24: 25 ug via INTRAVENOUS
  Filled 2022-03-24: qty 2

## 2022-03-24 MED ORDER — BUPIVACAINE HCL (PF) 0.5 % IJ SOLN
INTRAMUSCULAR | Status: AC
  Filled 2022-03-24: qty 30

## 2022-03-24 MED ORDER — FUROSEMIDE 10 MG/ML IJ SOLN
INTRAMUSCULAR | Status: AC
  Filled 2022-03-24: qty 4

## 2022-03-24 MED ORDER — SODIUM CHLORIDE 0.9 % IV SOLN: 250.0000 mL | INTRAVENOUS | Status: DC | PRN

## 2022-03-24 MED ORDER — OXYCODONE HCL 5 MG PO TABS: 5.0000 mg | ORAL_TABLET | Freq: Once | ORAL | Status: DC | PRN

## 2022-03-24 MED ORDER — ACETAMINOPHEN 10 MG/ML IV SOLN: 1000.0000 mg | Freq: Once | INTRAVENOUS | Status: DC | PRN

## 2022-03-24 MED ORDER — IBUPROFEN 400 MG PO TABS: 600.0000 mg | ORAL_TABLET | Freq: Four times a day (QID) | ORAL | Status: DC | PRN

## 2022-03-24 MED ORDER — PAROXETINE HCL 20 MG PO TABS
40.0000 mg | ORAL_TABLET | Freq: Every day | ORAL | Status: DC
Start: 2022-03-24 — End: 2022-03-25
  Administered 2022-03-24 – 2022-03-25 (×2): 40 mg via ORAL
  Filled 2022-03-24 (×2): qty 2

## 2022-03-24 MED ORDER — GABAPENTIN 300 MG PO CAPS: 300.0000 mg | ORAL_CAPSULE | Freq: Three times a day (TID) | ORAL | 2 refills | Status: DC

## 2022-03-24 MED ORDER — KETOROLAC TROMETHAMINE 30 MG/ML IJ SOLN
INTRAMUSCULAR | Status: DC | PRN
  Administered 2022-03-24: 30 mg via INTRAVENOUS

## 2022-03-24 MED ORDER — DEXAMETHASONE SODIUM PHOSPHATE 10 MG/ML IJ SOLN
INTRAMUSCULAR | Status: DC | PRN
  Administered 2022-03-24: 10 mg via INTRAVENOUS

## 2022-03-24 MED ORDER — SUCCINYLCHOLINE CHLORIDE 200 MG/10ML IV SOSY
PREFILLED_SYRINGE | INTRAVENOUS | Status: DC | PRN
  Administered 2022-03-24: 180 mg via INTRAVENOUS

## 2022-03-24 MED ORDER — ROCURONIUM BROMIDE 100 MG/10ML IV SOLN
INTRAVENOUS | Status: DC | PRN
  Administered 2022-03-24: 20 mg via INTRAVENOUS
  Administered 2022-03-24: 50 mg via INTRAVENOUS

## 2022-03-24 MED ORDER — PROPOFOL 10 MG/ML IV BOLUS
INTRAVENOUS | Status: DC | PRN
  Administered 2022-03-24: 180 mg via INTRAVENOUS

## 2022-03-24 MED ORDER — BUPIVACAINE HCL 0.5 % IJ SOLN
INTRAMUSCULAR | Status: DC | PRN
  Administered 2022-03-24: 5 mL

## 2022-03-24 MED ORDER — FENTANYL CITRATE (PF) 100 MCG/2ML IJ SOLN
INTRAMUSCULAR | Status: DC | PRN
  Administered 2022-03-24: 100 ug via INTRAVENOUS

## 2022-03-24 MED ORDER — DOCUSATE SODIUM 100 MG PO CAPS: 100.0000 mg | ORAL_CAPSULE | Freq: Two times a day (BID) | ORAL | 0 refills | Status: DC

## 2022-03-24 MED ORDER — PROMETHAZINE HCL 25 MG/ML IJ SOLN: 6.2500 mg | INTRAMUSCULAR | Status: DC | PRN

## 2022-03-24 MED ORDER — 0.9 % SODIUM CHLORIDE (POUR BTL) OPTIME
TOPICAL | Status: DC | PRN
  Administered 2022-03-24: 500 mL

## 2022-03-24 MED ORDER — ACETAMINOPHEN 500 MG PO TABS: 1000.0000 mg | ORAL_TABLET | Freq: Four times a day (QID) | ORAL | 0 refills | Status: AC

## 2022-03-24 MED ORDER — OXYCODONE HCL 5 MG PO TABS
5.0000 mg | ORAL_TABLET | Freq: Four times a day (QID) | ORAL | Status: DC | PRN
  Administered 2022-03-24 – 2022-03-25 (×3): 5 mg via ORAL
  Filled 2022-03-24 (×3): qty 1

## 2022-03-24 MED ORDER — CHLORHEXIDINE GLUCONATE 0.12 % MT SOLN
OROMUCOSAL | Status: AC
  Filled 2022-03-24: qty 15

## 2022-03-24 MED ORDER — OXYCODONE HCL 5 MG PO TABS: 5.0000 mg | ORAL_TABLET | ORAL | 0 refills | Status: DC | PRN

## 2022-03-24 SURGICAL SUPPLY — 39 items
BINDER ABDOMINAL 12 ML 46-62 (SOFTGOODS) ×1 IMPLANT
BLADE SURG SZ11 CARB STEEL (BLADE) ×2 IMPLANT
CATH ROBINSON RED A/P 16FR (CATHETERS) ×2 IMPLANT
CHLORAPREP W/TINT 26 (MISCELLANEOUS) ×2 IMPLANT
DERMABOND ADVANCED (GAUZE/BANDAGES/DRESSINGS) ×1
DERMABOND ADVANCED .7 DNX12 (GAUZE/BANDAGES/DRESSINGS) ×1 IMPLANT
DRAPE GENERAL ENDO 106X123.5 (DRAPES) ×2 IMPLANT
DRAPE LEGGINS SURG 28X43 STRL (DRAPES) ×2 IMPLANT
DRAPE UNDER BUTTOCK W/FLU (DRAPES) ×2 IMPLANT
DRAPE UTILITY 15X26 TOWEL STRL (DRAPES) ×4 IMPLANT
GAUZE 4X4 16PLY ~~LOC~~+RFID DBL (SPONGE) ×4 IMPLANT
GLOVE BIO SURGEON STRL SZ7 (GLOVE) ×4 IMPLANT
GLOVE SURG UNDER LTX SZ7.5 (GLOVE) ×2 IMPLANT
GOWN STRL REUS W/ TWL LRG LVL3 (GOWN DISPOSABLE) ×2 IMPLANT
GOWN STRL REUS W/TWL LRG LVL3 (GOWN DISPOSABLE) ×2
GRASPER SUT TROCAR 14GX15 (MISCELLANEOUS) ×2 IMPLANT
KIT PINK PAD W/HEAD ARE REST (MISCELLANEOUS) ×2
KIT PINK PAD W/HEAD ARM REST (MISCELLANEOUS) ×1 IMPLANT
KIT TURNOVER CYSTO (KITS) ×2 IMPLANT
LABEL OR SOLS (LABEL) ×2 IMPLANT
LIGASURE LAP MARYLAND 5MM 37CM (ELECTROSURGICAL) ×1 IMPLANT
LIGASURE VESSEL 5MM BLUNT TIP (ELECTROSURGICAL) IMPLANT
MANIFOLD NEPTUNE II (INSTRUMENTS) ×2 IMPLANT
NS IRRIG 500ML POUR BTL (IV SOLUTION) ×2 IMPLANT
PACK GYN LAPAROSCOPIC (MISCELLANEOUS) ×2 IMPLANT
PAD OB MATERNITY 4.3X12.25 (PERSONAL CARE ITEMS) ×2 IMPLANT
PAD PREP 24X41 OB/GYN DISP (PERSONAL CARE ITEMS) ×2 IMPLANT
SCRUB CHG 4% DYNA-HEX 4OZ (MISCELLANEOUS) ×2 IMPLANT
SET TUBE SMOKE EVAC HIGH FLOW (TUBING) ×2 IMPLANT
SLEEVE ENDOPATH XCEL 5M (ENDOMECHANICALS) ×5 IMPLANT
STRIP CLOSURE SKIN 1/4X4 (GAUZE/BANDAGES/DRESSINGS) ×2 IMPLANT
SUT MNCRL 4-0 (SUTURE) ×1
SUT MNCRL 4-0 27XMFL (SUTURE) ×1
SUT VIC AB 0 UR5 27 (SUTURE) ×2 IMPLANT
SUT VIC AB 2-0 UR6 27 (SUTURE) ×2 IMPLANT
SUTURE MNCRL 4-0 27XMF (SUTURE) ×1 IMPLANT
TROCAR 5M 150ML BLDLS (TROCAR) ×1 IMPLANT
TROCAR XCEL NON-BLD 5MMX100MML (ENDOMECHANICALS) ×2 IMPLANT
WATER STERILE IRR 500ML POUR (IV SOLUTION) ×2 IMPLANT

## 2022-03-24 NOTE — Op Note (Addendum)
AMELA BISSEN 03/24/2022  PREOPERATIVE DIAGNOSIS:  Undesired fertility, BMI 47  POSTOPERATIVE DIAGNOSIS:  Undesired fertility  PROCEDURE:  Laparoscopic Bilateral Tubal Sterilization using Bipolar Coagulation with partial salpingectomy, including fimbriae   Modifier 22 for significantly more time required due to high BMI. Extra instruments and trocar sites needed  ANESTHESIA:  General endotracheal  ANESTHESIOLOGIST: Foye Deer, MD Anesthesiologist: Foye Deer, MD CRNA: Lynden Oxford, CRNA; Philbert Riser, CRNA  SURGEON: Cline Cools, MD  COMPLICATIONS:  Difficulty with O2 sats, pulm edema noted on chest xray in the PACU.  ESTIMATED BLOOD LOSS:  Less than 20 ml.  FLUIDS:  URINE OUTPUT:  100 ml of clear urine.  INDICATIONS: 26 y.o. [id number]  with undesired fertility, desires permanent sterilization. Other reversible forms of contraception were discussed with patient; she declines all other modalities.  Risks of procedure discussed with patient including permanence of method, bleeding, infection, injury to surrounding organs and need for additional procedures including laparotomy, risk of regret.  Failure risk of 0.5-1% with increased risk of ectopic gestation if pregnancy occurs was also discussed with patient.      FINDINGS:  Normal uterus, tubes, and ovaries.  TECHNIQUE:  The patient was taken to the operating room where general anesthesia was obtained without difficulty.  She was then placed in the dorsal lithotomy position and prepared and draped in sterile fashion.  The bladder was cathed for an estimated amount of clear urine. After an adequate timeout was performed, a bivalved speculum was then placed in the patient's vagina, and the anterior lip of cervix grasped with the single-tooth tenaculum.  The uterine manipulator was then advanced into the uterus.  The speculum was removed from the vagina.   Attention was then turned to the patient's  abdomen where a 5-mm skin incision was made in the umbilical fold.  The Optiview 5-mm trocar and sleeve were then advanced without difficulty with the laparoscope under direct visualization into the abdomen.  However, visualization was not assured, and the bariatric scope was used. After confirming an OG tube, the LUQ was used to enter the abdomen without difficulty. The abdomen was then insufflated with carbon dioxide gas and adequate pneumoperitoneum was obtained.  A survey of the patient's pelvis and abdomen revealed entirely normal anatomy.     A 5mm trocar was placed in the umbilical incision and in the LLQ  The fallopian tubes were observed and found to be normal in appearance. A Ligasure device was then advanced through the operative port and used to coagulate and excise the distal portion of the Fallopian tube, including the fimbriated ends.  Good blanching and coagulation was noted at the site of the application.  There was no bleeding noted in the mesosalpinx.  A similar process was carried out on the right fallopian tube allowing for bilateral tubal sterilization.  Good hemostasis was noted overall. The instruments were then removed from the patient's abdomen and the skin was closed with Dermabond.  The uterine manipulator and the tenaculum were removed from the vagina without complications. The patient tolerated the procedure well.  Sponge, lap, and needle counts were correct times two.  The patient was then taken to the recovery room awake, extubated and in stable condition, but requiring bipap  Hospitalist consulted. 13m Lasix given after chest x-ray noted pulm edema. ABG pre-bipap 7.33/44/56/23.2.  Plan to monitor in PACU with decision to admit for overnight monitoring pending response.

## 2022-03-25 ENCOUNTER — Observation Stay: Payer: Medicaid Other

## 2022-03-25 ENCOUNTER — Observation Stay (HOSPITAL_BASED_OUTPATIENT_CLINIC_OR_DEPARTMENT_OTHER)
Admission: RE | Admit: 2022-03-25 | Discharge: 2022-03-25 | Disposition: A | Discharge status: Home / Self Care | Payer: Medicaid Other | Source: Home / Self Care | Attending: Internal Medicine | Admitting: Internal Medicine

## 2022-03-25 DIAGNOSIS — J9601 Acute respiratory failure with hypoxia: Secondary | ICD-10-CM | POA: Diagnosis not present

## 2022-03-25 DIAGNOSIS — I5031 Acute diastolic (congestive) heart failure: Secondary | ICD-10-CM

## 2022-03-25 DIAGNOSIS — J810 Acute pulmonary edema: Secondary | ICD-10-CM | POA: Diagnosis not present

## 2022-03-25 DIAGNOSIS — Z302 Encounter for sterilization: Secondary | ICD-10-CM | POA: Diagnosis not present

## 2022-03-25 LAB — CBC
HCT: 38.5 % (ref 36.0–46.0)
Hemoglobin: 12.5 g/dL (ref 12.0–15.0)
MCH: 26.5 pg (ref 26.0–34.0)
MCHC: 32.5 g/dL (ref 30.0–36.0)
MCV: 81.6 fL (ref 80.0–100.0)
Platelets: 400 10*3/uL (ref 150–400)
RBC: 4.72 MIL/uL (ref 3.87–5.11)
RDW: 14.2 % (ref 11.5–15.5)
WBC: 14.3 10*3/uL — ABNORMAL HIGH (ref 4.0–10.5)
nRBC: 0 % (ref 0.0–0.2)

## 2022-03-25 LAB — ECHOCARDIOGRAM COMPLETE
AR max vel: 2.82 cm2
AV Peak grad: 9.6 mmHg
Ao pk vel: 1.55 m/s
Area-P 1/2: 3.61 cm2
Height: 66 in
S' Lateral: 2.8 cm
Single Plane A4C EF: 61 %
Weight: 4652.59 oz

## 2022-03-25 LAB — BASIC METABOLIC PANEL
Anion gap: 4 — ABNORMAL LOW (ref 5–15)
BUN: 15 mg/dL (ref 6–20)
CO2: 26 mmol/L (ref 22–32)
Calcium: 8.4 mg/dL — ABNORMAL LOW (ref 8.9–10.3)
Chloride: 108 mmol/L (ref 98–111)
Creatinine, Ser: 0.53 mg/dL (ref 0.44–1.00)
GFR, Estimated: >60 mL/min (ref >60)
Glucose, Bld: 115 mg/dL — ABNORMAL HIGH (ref 70–99)
Potassium: 3.7 mmol/L (ref 3.5–5.1)
Sodium: 138 mmol/L (ref 135–145)

## 2022-03-25 LAB — HIV ANTIBODY (ROUTINE TESTING W REFLEX): HIV Screen 4th Generation wRfx: NONREACTIVE

## 2022-03-25 MED ORDER — FUROSEMIDE 20 MG PO TABS: ORAL_TABLET | ORAL | 0 refills | Status: AC

## 2022-03-25 MED ORDER — IPRATROPIUM-ALBUTEROL 0.5-2.5 (3) MG/3ML IN SOLN: 3.0000 mL | Freq: Four times a day (QID) | RESPIRATORY_TRACT | Status: DC | PRN

## 2022-03-25 MED ORDER — PERFLUTREN LIPID MICROSPHERE
1.0000 mL | INTRAVENOUS | Status: AC | PRN
  Administered 2022-03-25: 4 mL via INTRAVENOUS

## 2022-03-25 NOTE — Assessment & Plan Note (Signed)
Pulmonary edema likely due to fluid overload, complicated by morbid obesity possible underlying restrictive lung disease.  Ruled out CHF or other cardiac complication  No underlying lung disease but may benefit from outpatient sleep study and PFT  Improved with diuresis, continue p.o. Lasix as per med rec, patient given parameters to stop this medication if needed

## 2022-03-25 NOTE — Progress Notes (Signed)
*  PRELIMINARY RESULTS* Echocardiogram 2D Echocardiogram has been performed. Definity IV ultrasound imaging agent used on this study.  Lenor Coffin 03/25/2022, 10:43 AM

## 2022-03-27 LAB — SURGICAL PATHOLOGY

## 2022-03-29 LAB — BLOOD GAS, ARTERIAL
Acid-base deficit: 2.8 mmol/L — ABNORMAL HIGH (ref 0.0–2.0)
Bicarbonate: 23.2 mmol/L (ref 20.0–28.0)
FIO2: 55 %
O2 Content: 14 L/min
O2 Saturation: 87.9 %
Patient temperature: 37
pCO2 arterial: 44 mmHg (ref 32–48)
pH, Arterial: 7.33 — ABNORMAL LOW (ref 7.35–7.45)
pO2, Arterial: 56 mmHg — ABNORMAL LOW (ref 83–108)

## 2022-05-18 ENCOUNTER — Encounter: Payer: Self-pay | Admitting: Obstetrics and Gynecology

## 2022-06-26 ENCOUNTER — Ambulatory Visit: Payer: Self-pay | Admitting: Internal Medicine

## 2022-06-26 NOTE — Progress Notes (Signed)
Sleep Medicine   Office Visit  Patient Name: Tamara Harrison DOB: 06/27/1967 MRN [id number]    Chief Complaint: ***  Brief History:  Tamara presents for initial sleep consult with a *** history of ***. Sleep quality is ***. This is noted *** nights. The patient's bed partner reports  *** at night. The patient relates the following symptoms: *** are also present. The patient goes to sleep at *** and wakes up at ***.   Sleep quality is *** when outside home environment.  Patient has noted *** of her legs at night.  The patient  relates *** behavior during the night.  The patient *** a history of psychiatric problems. The Epworth Sleepiness Score is *** out of 24 .  The patient relates  Cardiovascular risk factors include: *** The patient reports ***    ROS  General: (-) fever, (-) chills, (-) night sweat Nose and Sinuses: (-) nasal stuffiness or itchiness, (-) postnasal drip, (-) nosebleeds, (-) sinus trouble. Mouth and Throat: (-) sore throat, (-) hoarseness. Neck: (-) swollen glands, (-) enlarged thyroid, (-) neck pain. Respiratory: *** cough, *** shortness of breath, *** wheezing. Neurologic: *** numbness, *** tingling. Psychiatric: *** anxiety, *** depression Sleep behavior: ***sleep paralysis ***hypnogogic hallucinations ***dream enactment      ***vivid dreams ***cataplexy ***night terrors ***sleep walking   Current Medication: No outpatient encounter medications on file as of 11/23/2022.   No facility-administered encounter medications on file as of 11/23/2022.    Surgical History: *** The histories are not reviewed yet. Please review them in the "History" navigator section and refresh this SmartLink.  Medical History: No past medical history on file.  Family History: Non contributory to the present illness  Social History: Social History   Socioeconomic History   Marital status: Not on file    Spouse name: Not on file   Number of children: Not on file   Years of  education: Not on file   Highest education level: Not on file  Occupational History   Not on file  Tobacco Use   Smoking status: Not on file   Smokeless tobacco: Not on file  Substance and Sexual Activity   Alcohol use: Not on file   Drug use: Not on file   Sexual activity: Not on file  Other Topics Concern   Not on file  Social History Narrative   Not on file   Social Determinants of Health   Financial Resource Strain: Not on file  Food Insecurity: Not on file  Transportation Needs: Not on file  Physical Activity: Not on file  Stress: Not on file  Social Connections: Not on file  Intimate Partner Violence: Not on file    Vital Signs: There were no vitals taken for this visit. There is no height or weight on file to calculate BMI.   Examination: General Appearance: The patient is well-developed, well-nourished, and in no distress. Neck Circumference: *** Skin: Gross inspection of skin unremarkable. Head: normocephalic, no gross deformities. Eyes: no gross deformities noted. ENT: ears appear grossly normal Neurologic: Alert and oriented. No involuntary movements.    STOP BANG RISK ASSESSMENT S (snore) Have you been told that you snore?     YES/N   T (tired) Are you often tired, fatigued, or sleepy during the day?   YES/NO  O (obstruction) Do you stop breathing, choke, or gasp during sleep? YES/NO   P (pressure) Do you have or are you being treated for high blood pressure? YES/NO   B (

## 2023-09-03 IMAGING — US US MFM FETAL BPP W/O NON-STRESS
1 series · 15 of 21 positions shown · non-contrast
Comparison: none

[Series 1: us mfm fetal bpp w/o non-stress · 21 acquisitions, 15 frames shown]
[im 1/21]
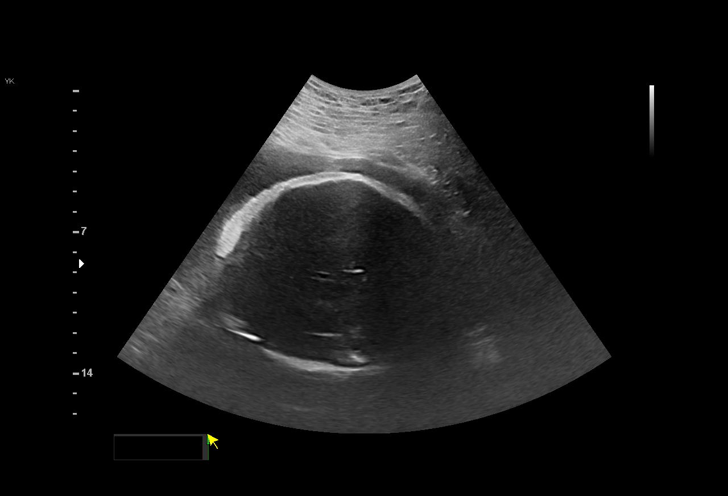
[im 3/21]
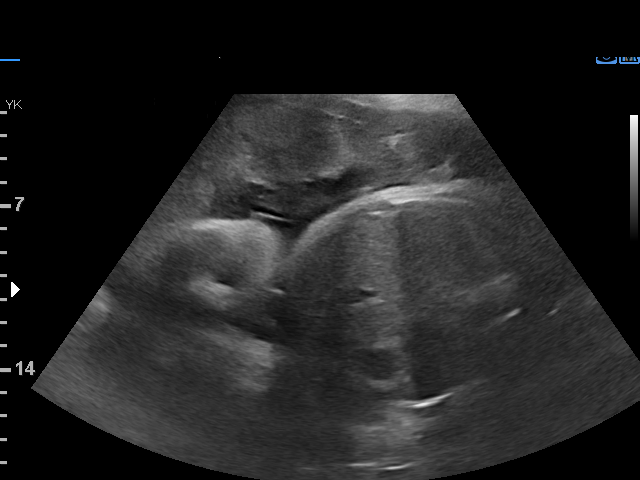
[im 4/21]
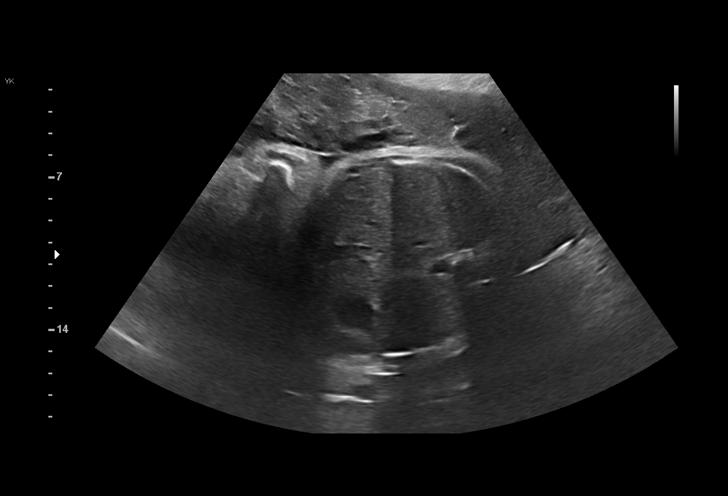
[im 5/21]
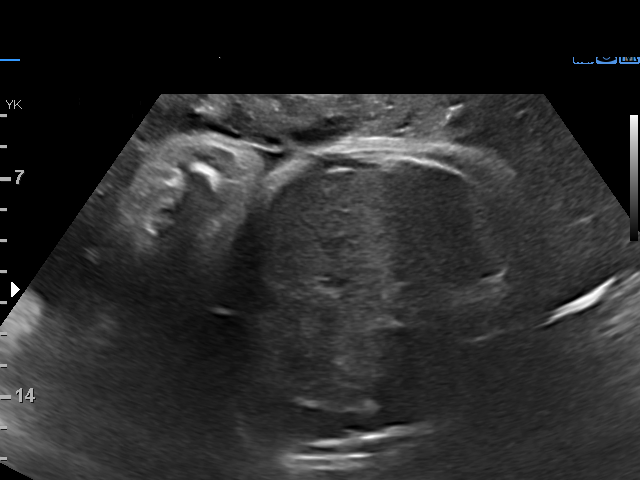
[im 7/21]
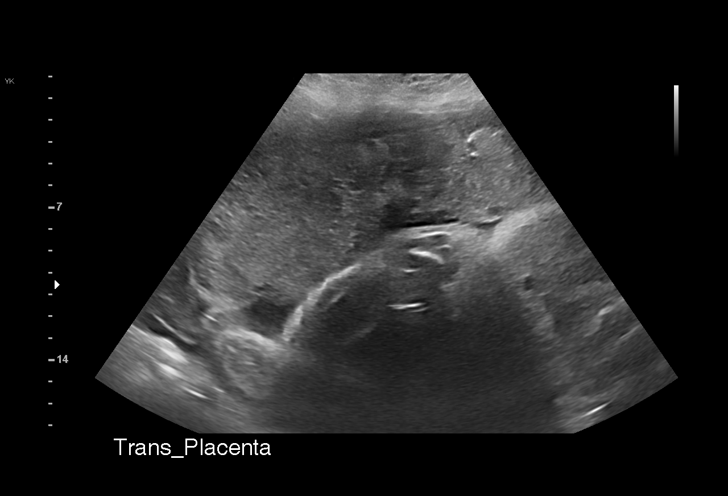
[im 8/21]
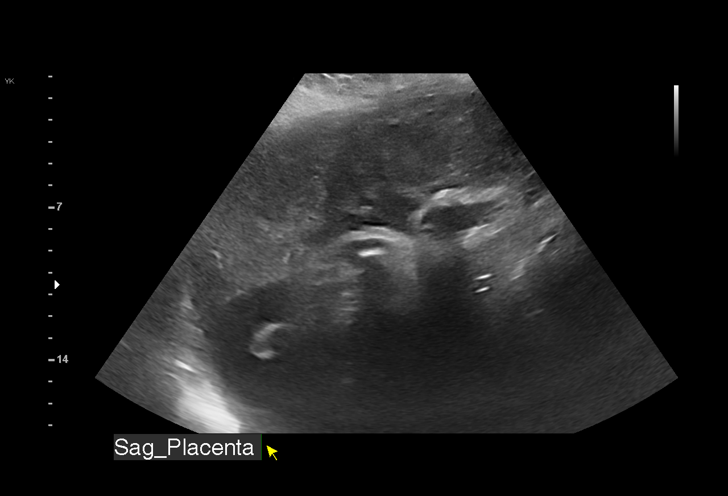
[im 10/21]
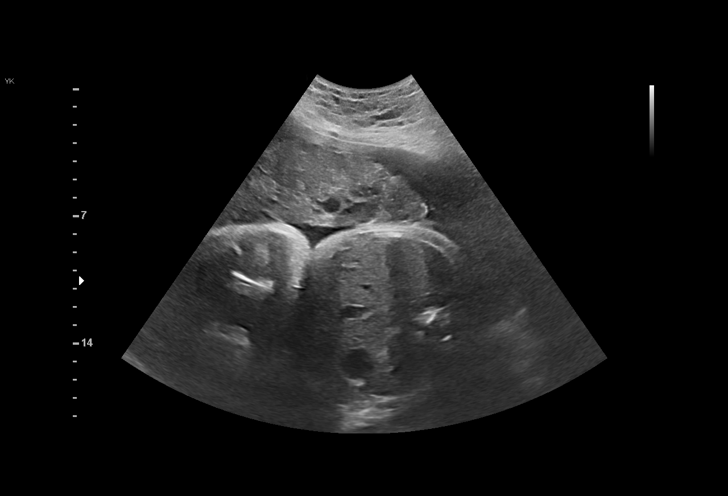
[im 11/21]
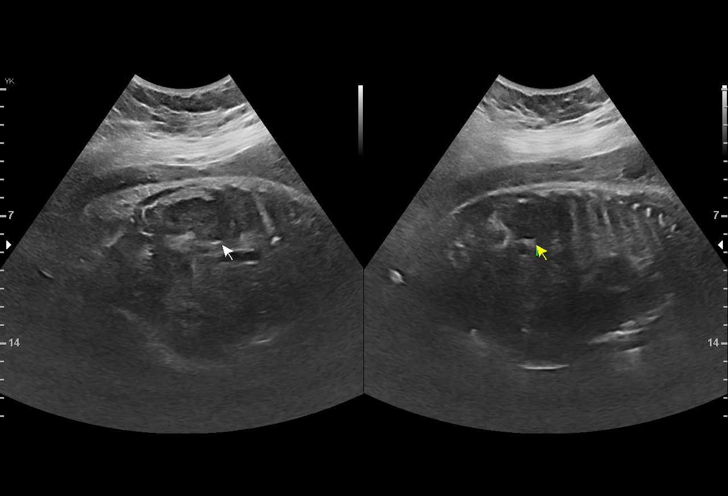
[im 12/21]
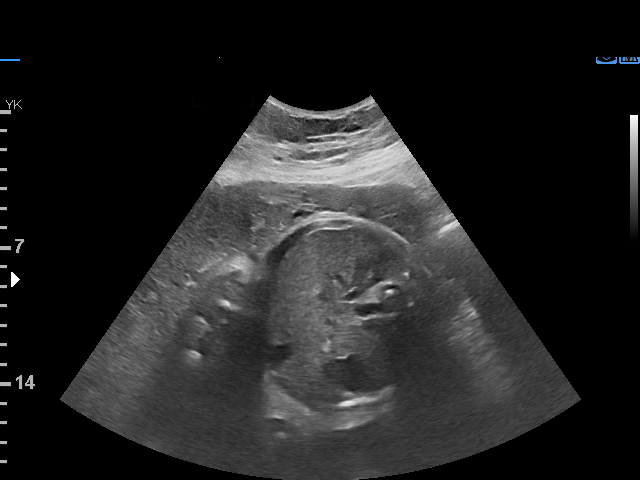
[im 14/21]
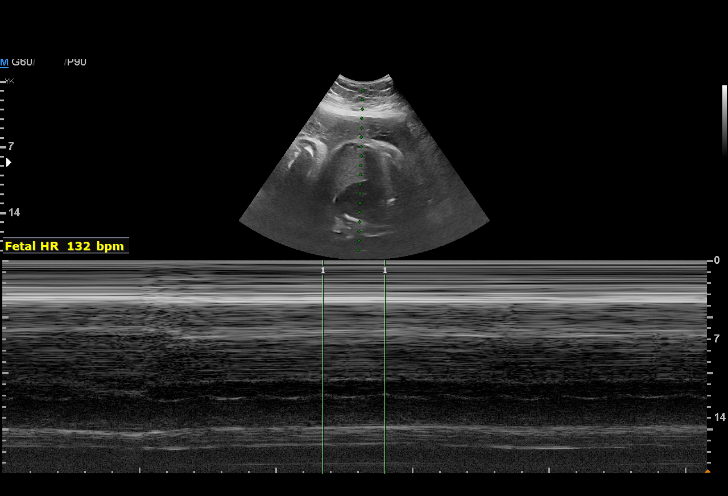
[im 15/21]
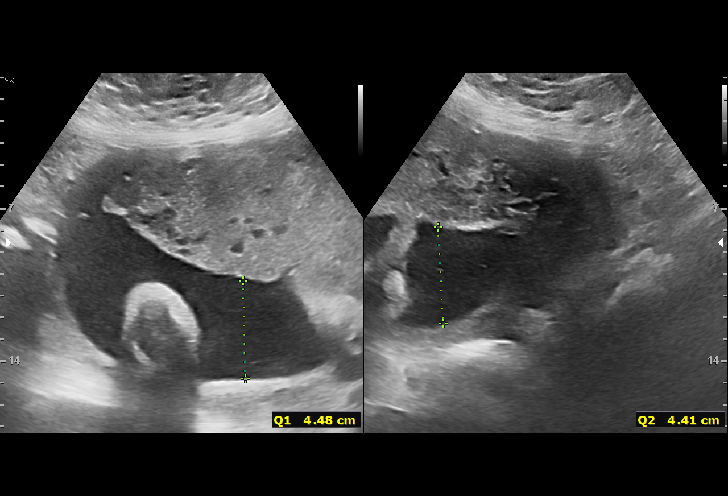
[im 17/21]
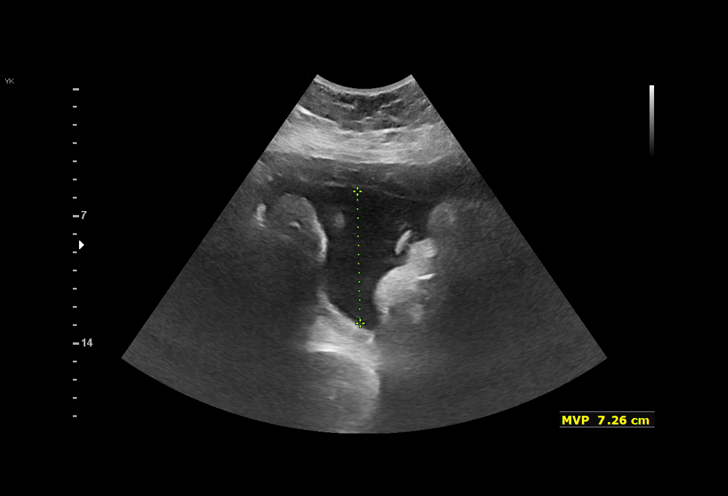
[im 18/21]
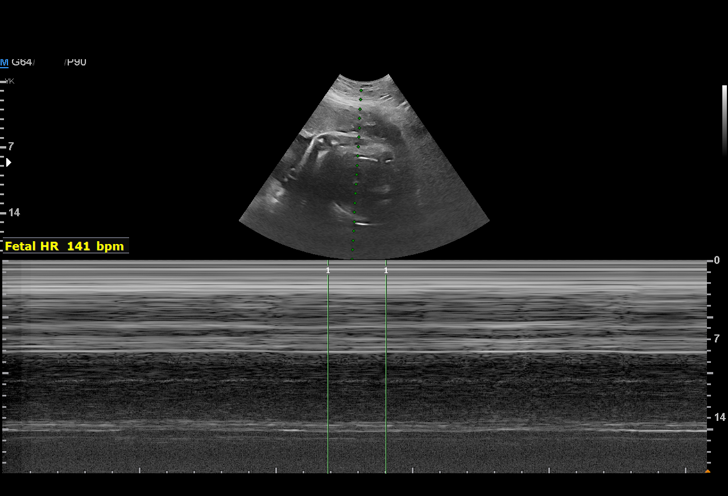
[im 19/21]
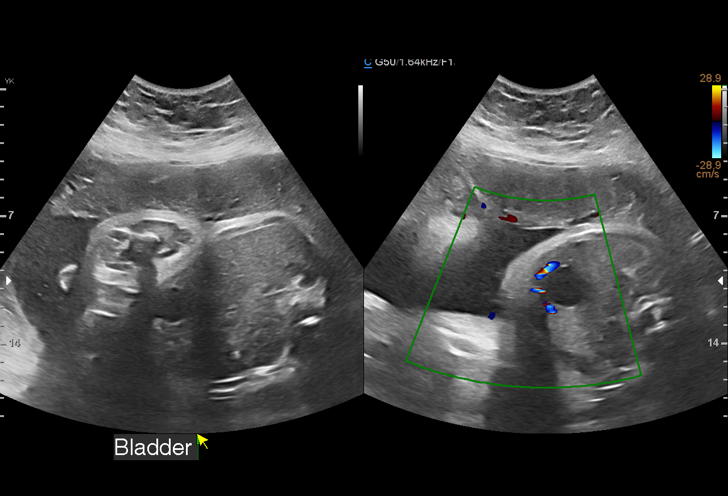
[im 21/21]
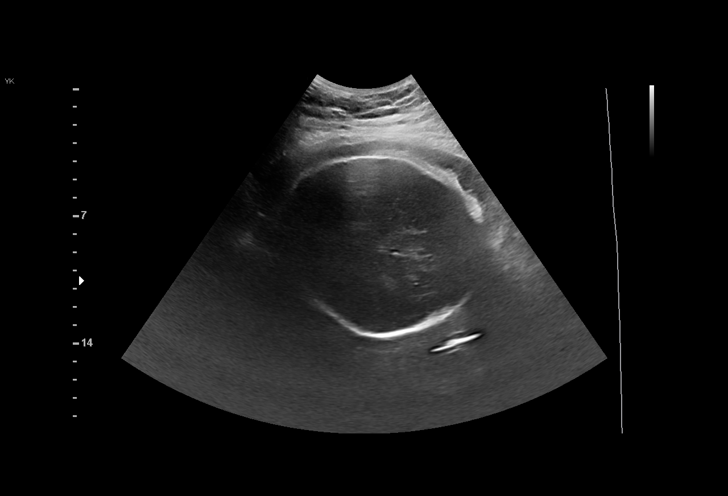

[15 of 21 positions shown; findings below may reference images not displayed]

Referred By:      AARONCITO BERNAL

Indications

 Obesity complicating pregnancy, second
 trimester (>40)
 Hypothyroid
 36 weeks gestation of pregnancy
Fetal Evaluation

 Num Of Fetuses:         1
 Fetal Heart Rate(bpm):  132
 Cardiac Activity:       Observed
 Presentation:           Cephalic
 Placenta:               Anterior

 Amniotic Fluid
 AFI FV:      Within normal limits

 AFI Sum(cm)     %Tile       Largest Pocket(cm)
 13.1            45

 RUQ(cm)       RLQ(cm)       LUQ(cm)        LLQ(cm)

Biophysical Evaluation

 Amniotic F.V:   Within normal limits       F. Tone:        Observed
 F. Movement:    Observed                   Score:          [DATE]
 F. Breathing:   Observed
OB History

 Gravidity:    2         Term:   1
 Living:       1
Gestational Age

 LMP:           36w 2d        Date:  01/23/21                 EDD:   10/30/21
 Best:          36w 2d     Det. By:  LMP  (01/23/21)          EDD:   10/30/21
Anatomy

 Stomach:               Appears normal, left   Kidneys:                Appear normal
                        sided
 Cord Vessels:          Appears normal (3      Bladder:                Appears normal
                        vessel cord)
Impression

 Maternal obesity.
 BP today at our office is 109/75 mm Hg.

 Amniotic fluid is normal and good fetal activity is seen
 .Antenatal testing is reassuring. BPP [DATE].
Recommendations

 -Patient has an appointment for BPP next week.
 -Continue weekly BPP or NST till delivery.
                 [place], Erica

## 2023-09-10 IMAGING — US US MFM FETAL BPP W/O NON-STRESS
1 series · 12 of 22 positions shown · non-contrast
Comparison: none

[Series 1: us mfm fetal bpp w/o non-stress · 22 acquisitions, 12 frames shown]
[im 1/22]
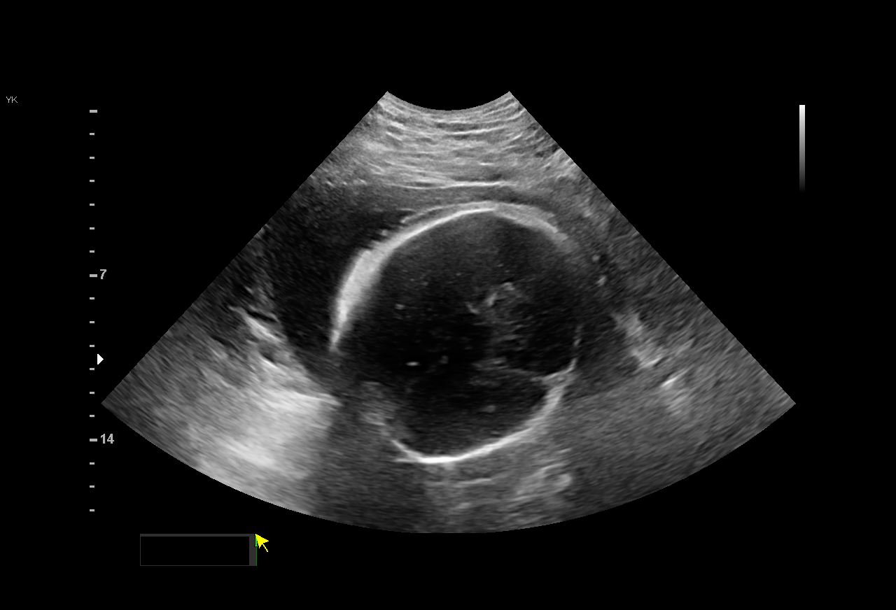
[im 3/22]
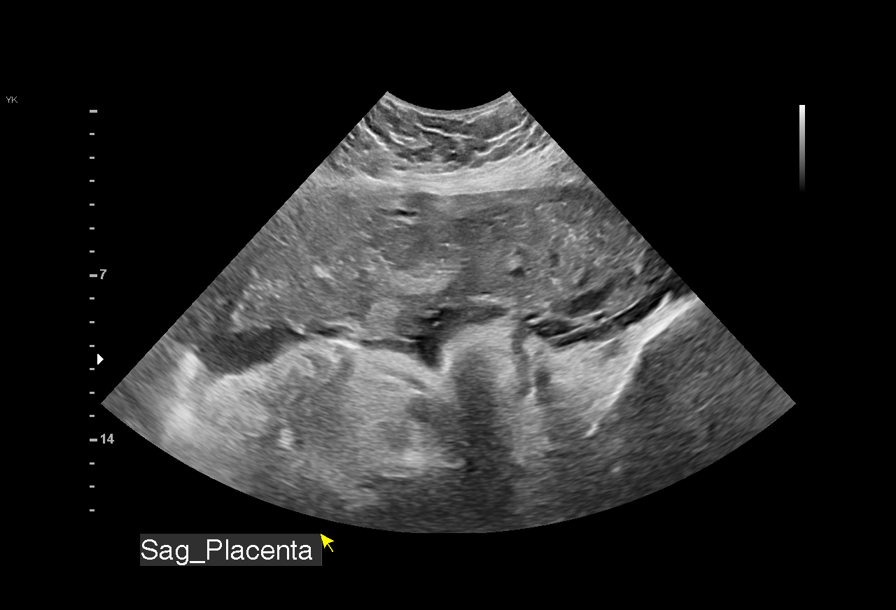
[im 5/22]
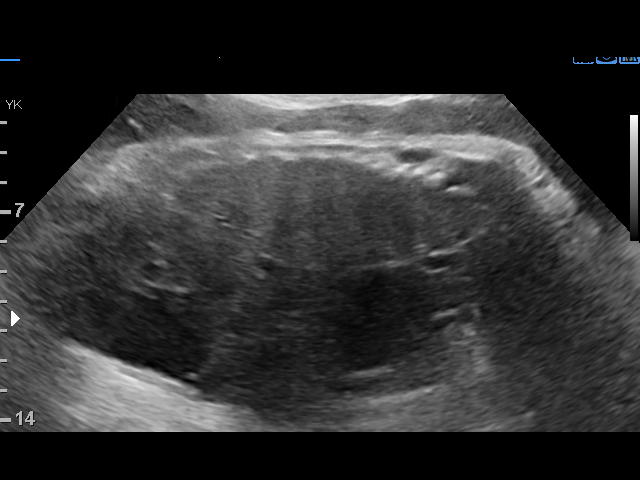
[im 7/22]
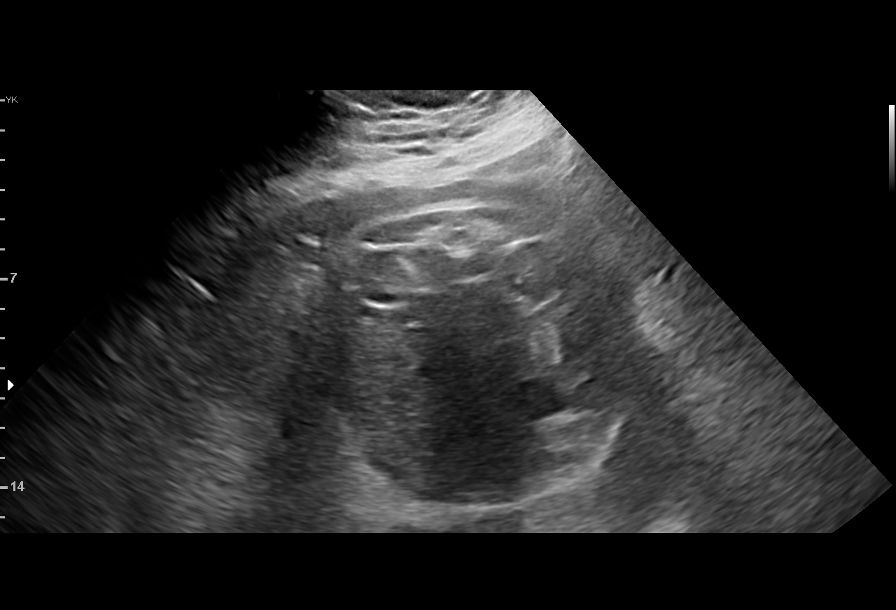
[im 9/22]
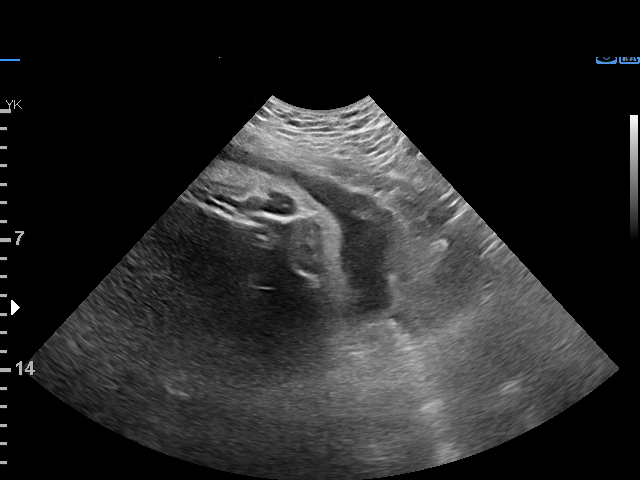
[im 11/22]
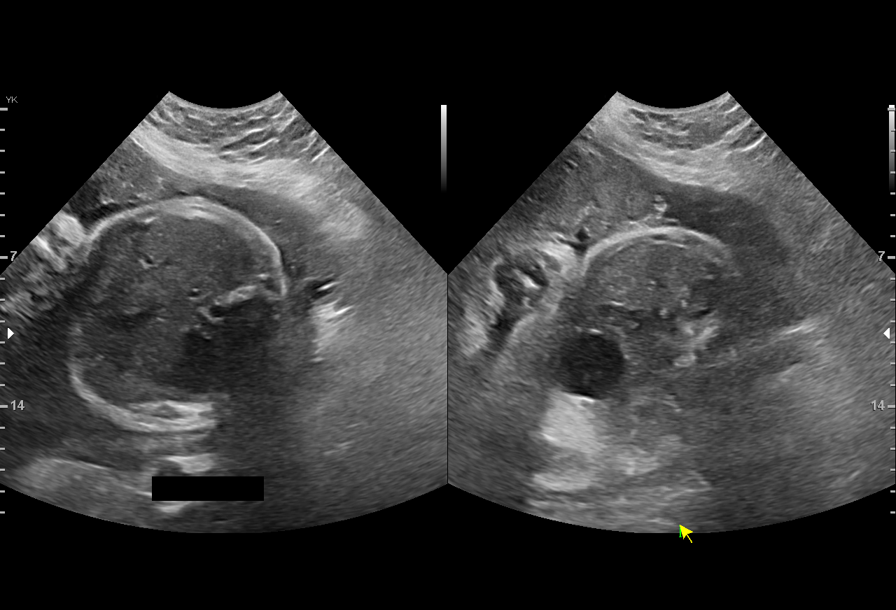
[im 12/22]
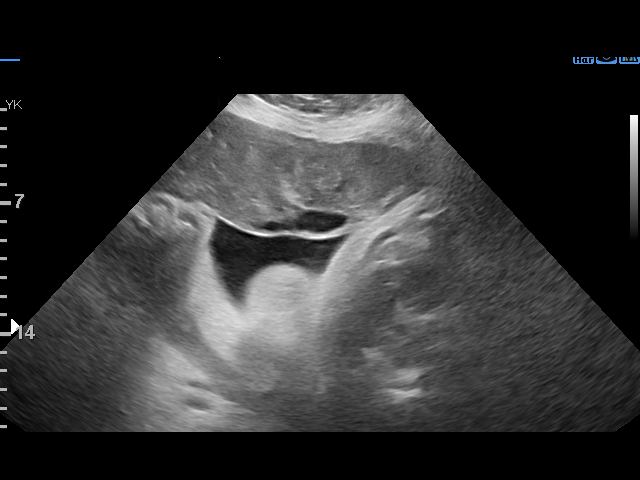
[im 14/22]
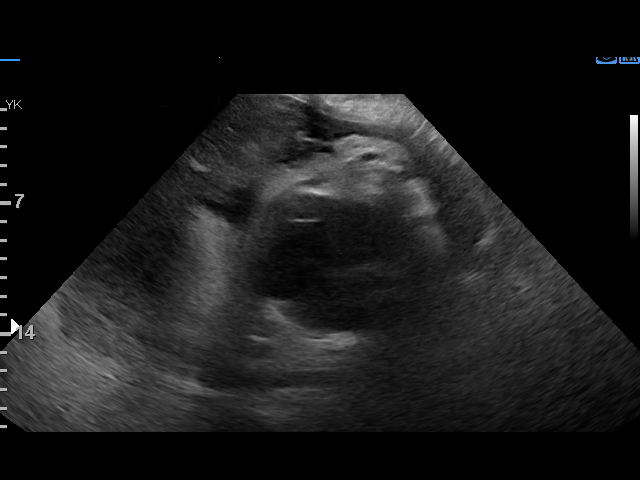
[im 16/22]
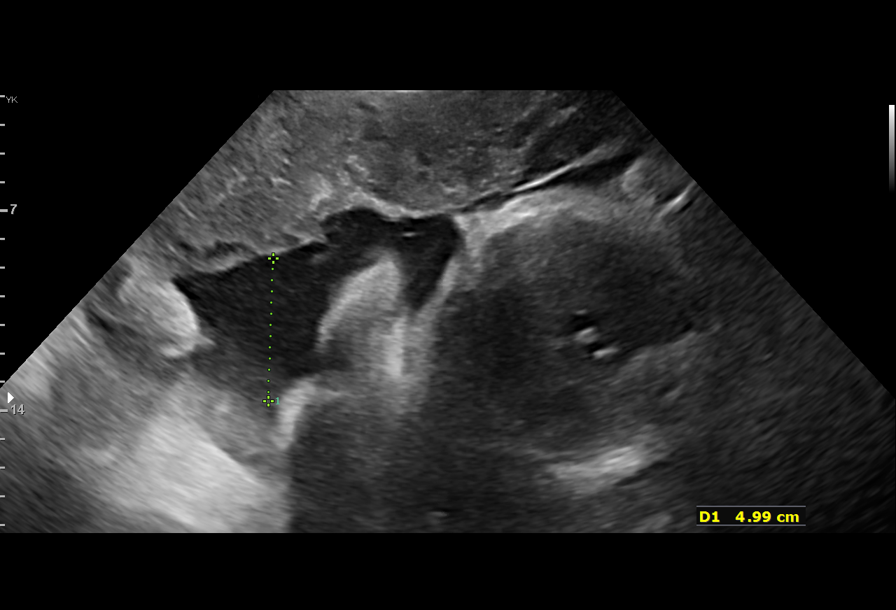
[im 18/22]
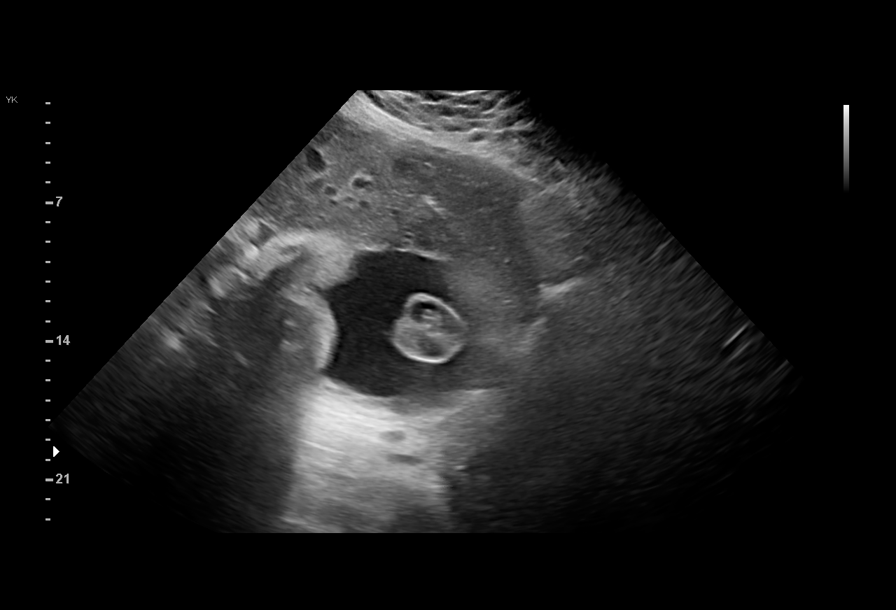
[im 20/22]
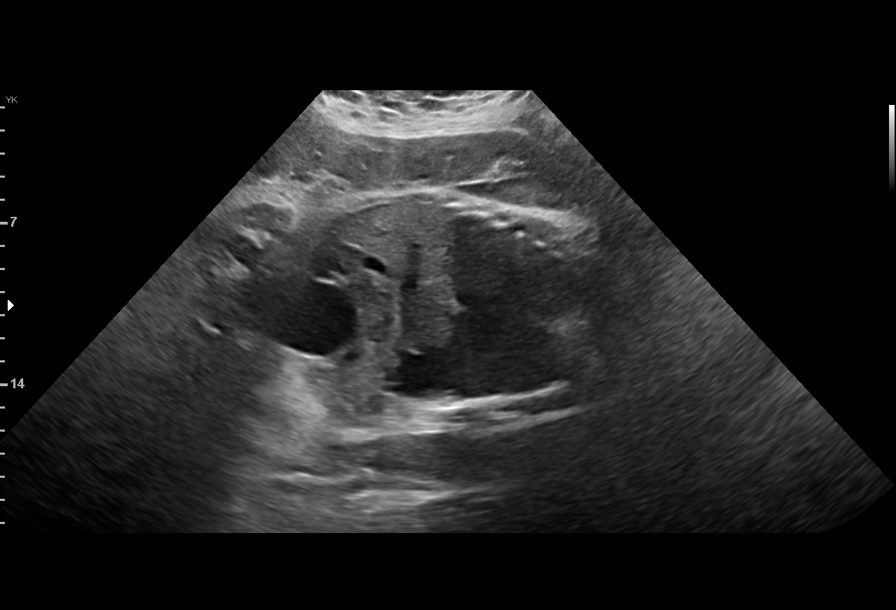
[im 22/22]
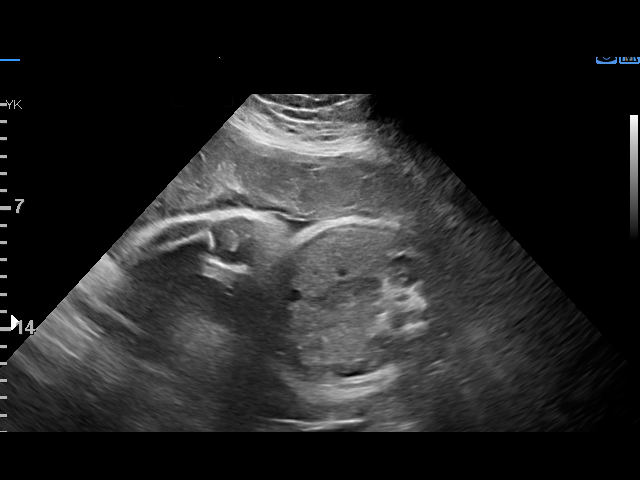

[12 of 22 positions shown; findings below may reference images not displayed]

Referred By:      FERIENHAUS ERXLEBEN

Indications

 Obesity complicating pregnancy, second
 trimester (>40)
 Hypothyroid
 37 weeks gestation of pregnancy
Fetal Evaluation

 Num Of Fetuses:         1
 Fetal Heart Rate(bpm):  167
 Cardiac Activity:       Observed
 Presentation:           Cephalic
 Placenta:               Anterior

 Amniotic Fluid
 AFI FV:      Within normal limits

 AFI Sum(cm)     %Tile       Largest Pocket(cm)
 15.6            59          7

 RUQ(cm)       RLQ(cm)       LUQ(cm)        LLQ(cm)
 5.2           2             7
Biophysical Evaluation

 Amniotic F.V:   Within normal limits       F. Tone:        Observed
 F. Movement:    Observed                   Score:          [DATE]
 F. Breathing:   Observed
OB History

 Gravidity:    2         Term:   1
 Living:       1
Gestational Age

 LMP:           37w 2d        Date:  01/23/21                 EDD:   10/30/21
 Best:          37w 2d     Det. By:  LMP  (01/23/21)          EDD:   10/30/21
Anatomy

 Lips:                  Appears normal         Kidneys:                Appear normal
 Stomach:               Appears normal, left   Bladder:                Appears normal
                        sided

 Other:  Fetus appears to be a male. Technicallly difficult due to advanced GA
         and maternal habitus.
Comments

 This patient was seen for a biophysical profile due to
 maternal obesity and hypothyroidism.  She denies any
 problems since her last exam.
 A biophysical profile performed today was [DATE].
 There was normal amniotic fluid noted on today's ultrasound
 exam.
 She will return in 1 week for an NST.

## 2023-12-04 ENCOUNTER — Other Ambulatory Visit: Payer: Self-pay | Admitting: Physician Assistant

## 2023-12-04 DIAGNOSIS — R1013 Epigastric pain: Secondary | ICD-10-CM

## 2023-12-04 DIAGNOSIS — R748 Abnormal levels of other serum enzymes: Secondary | ICD-10-CM

## 2023-12-07 ENCOUNTER — Ambulatory Visit
Admission: RE | Admit: 2023-12-07 | Discharge: 2023-12-07 | Disposition: A | Discharge status: Home / Self Care | Payer: MEDICAID | Source: Ambulatory Visit | Attending: Physician Assistant | Admitting: Physician Assistant

## 2023-12-07 DIAGNOSIS — R1013 Epigastric pain: Secondary | ICD-10-CM | POA: Insufficient documentation

## 2023-12-07 DIAGNOSIS — R748 Abnormal levels of other serum enzymes: Secondary | ICD-10-CM | POA: Diagnosis present

## 2024-02-21 IMAGING — DX DG CHEST 1V
1 series · 1 of 1 positions shown · non-contrast
Comparison: Chest radiographs 04/18/2012

CLINICAL DATA: Respiratory distress following tubal ligation
surgery today.

EXAM:
CHEST  1 VIEW

[chest ap]
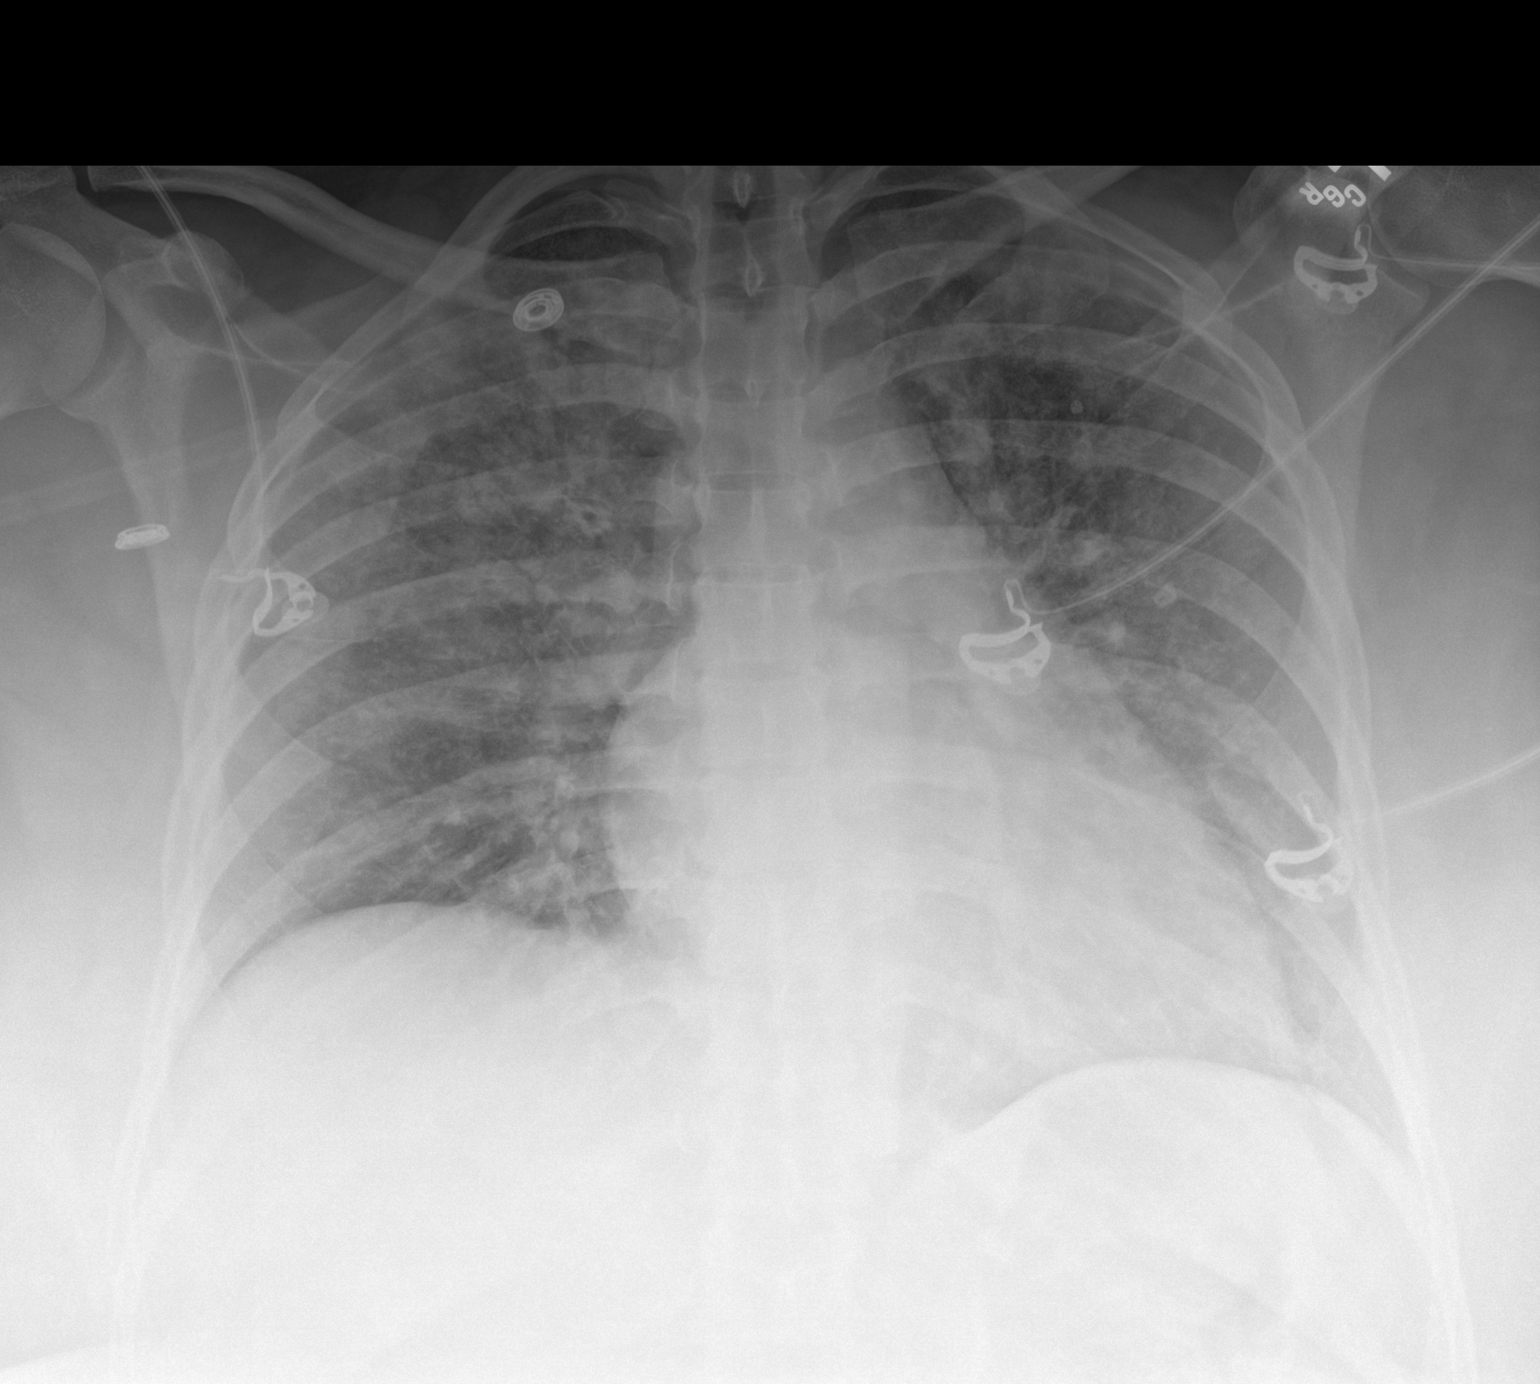

[1 of 1 positions shown; findings below may reference images not displayed]

FINDINGS: The cardiac silhouette is upper limits of normal in size. There is
new pulmonary vascular congestion with diffuse peribronchial
cuffing. No sizable pleural effusion or pneumothorax is identified.
No acute osseous abnormality is seen.
IMPRESSION: New pulmonary edema.

## 2024-05-15 ENCOUNTER — Other Ambulatory Visit: Payer: Self-pay | Admitting: Internal Medicine

## 2024-05-15 DIAGNOSIS — K8020 Calculus of gallbladder without cholecystitis without obstruction: Secondary | ICD-10-CM

## 2024-05-15 DIAGNOSIS — R1013 Epigastric pain: Secondary | ICD-10-CM

## 2024-06-04 ENCOUNTER — Ambulatory Visit: Payer: MEDICAID | Admitting: Anesthesiology

## 2024-06-04 ENCOUNTER — Encounter: Payer: Self-pay | Admitting: Internal Medicine

## 2024-06-04 ENCOUNTER — Ambulatory Visit
Admission: RE | Admit: 2024-06-04 | Discharge: 2024-06-04 | Disposition: A | Discharge status: Home / Self Care | Payer: MEDICAID | Attending: Internal Medicine | Admitting: Internal Medicine

## 2024-06-04 ENCOUNTER — Other Ambulatory Visit: Payer: Self-pay

## 2024-06-04 ENCOUNTER — Encounter
Admission: RE | Disposition: A | Discharge status: Home / Self Care | Payer: Self-pay | Source: Home / Self Care | Attending: Internal Medicine

## 2024-06-04 DIAGNOSIS — K2970 Gastritis, unspecified, without bleeding: Secondary | ICD-10-CM | POA: Diagnosis not present

## 2024-06-04 DIAGNOSIS — Z79899 Other long term (current) drug therapy: Secondary | ICD-10-CM | POA: Diagnosis not present

## 2024-06-04 DIAGNOSIS — F32A Depression, unspecified: Secondary | ICD-10-CM | POA: Diagnosis not present

## 2024-06-04 DIAGNOSIS — R1013 Epigastric pain: Secondary | ICD-10-CM | POA: Insufficient documentation

## 2024-06-04 DIAGNOSIS — F419 Anxiety disorder, unspecified: Secondary | ICD-10-CM | POA: Insufficient documentation

## 2024-06-04 DIAGNOSIS — K8020 Calculus of gallbladder without cholecystitis without obstruction: Secondary | ICD-10-CM | POA: Insufficient documentation

## 2024-06-04 DIAGNOSIS — B9681 Helicobacter pylori [H. pylori] as the cause of diseases classified elsewhere: Secondary | ICD-10-CM | POA: Insufficient documentation

## 2024-06-04 HISTORY — PX: ESOPHAGOGASTRODUODENOSCOPY: SHX5428

## 2024-06-04 LAB — POCT PREGNANCY, URINE: Preg Test, Ur: NEGATIVE

## 2024-06-04 SURGERY — EGD (ESOPHAGOGASTRODUODENOSCOPY)
Anesthesia: General

## 2024-06-04 MED ORDER — SODIUM CHLORIDE 0.9 % IV SOLN: INTRAVENOUS | Status: DC

## 2024-06-04 MED ORDER — PROPOFOL 10 MG/ML IV BOLUS
INTRAVENOUS | Status: DC | PRN
  Administered 2024-06-04: 50 mg via INTRAVENOUS
  Administered 2024-06-04: 150 mg via INTRAVENOUS

## 2024-06-04 NOTE — Interval H&P Note (Signed)
 History and Physical Interval Note:  06/04/2024 12:41 PM  Tamara Harrison  has presented today for surgery, with the diagnosis of GERD.  The various methods of treatment have been discussed with the patient and family. After consideration of risks, benefits and other options for treatment, the patient has consented to  Procedure(s): EGD (ESOPHAGOGASTRODUODENOSCOPY) (N/A) as a surgical intervention.  The patient's history has been reviewed, patient examined, no change in status, stable for surgery.  I have reviewed the patient's chart and labs.  Questions were answered to the patient's satisfaction.     [place], Kalib Bhagat

## 2024-06-04 NOTE — H&P (Signed)
 Outpatient short stay form Pre-procedure 06/04/2024 11:54 AM Tamara Harrison K. Aundria, M.D.  Primary Physician: Tamara Harrison [facility]  Reason for visit:    Diagnoses  Calculus of gallbladder without cholecystitis without obstruction  Epigastric pain      History of present illness:  Tamara Harrison is a pleasant 28 year old Hispanic female presenting for gastroenterology consultation requested by Dr. Lorel for abdominal pain and cholelithiasis. Patient noticed epigastric pain in March that was sharp and it was associated with vomiting. Patient denies any frequent GERD, dysphagia, vomiting or hematemesis. Patient does get some discomfort in meals described as deep pain without symptoms of sharp stabbing discomfort. She is concerned about gallbladder disease given her report that her mother, brother, father and maternal grandmother all required cholecystectomies. Patient has lost weight (approximately 70 pounds) since being placed on Wegovy earlier this year. She admits to taking Advil  only about once per month. Patient denies lower GI symptoms such as change in bowel habits, hematochezia or lower abdominal pain.     Current Facility-Administered Medications:    0.9 %  sodium chloride  infusion, , Intravenous, Continuous, Tamara Harrison, Tamara Harrison K, MD, Last Rate: 20 mL/hr at 06/04/24 1031, New Bag at 06/04/24 1031  Medications Prior to Admission  Medication Sig Dispense Refill Last Dose/Taking   PARoxetine  (PAXIL ) 20 MG tablet Take 20 mg by mouth daily.   Past Week   PARoxetine  (PAXIL ) 40 MG tablet Take 40 mg by mouth daily.   Past Week   semaglutide-weight management (WEGOVY) 1 MG/0.5ML SOAJ SQ injection Inject 1 mg into the skin once a week.   05/19/2024   docusate sodium  (COLACE) 100 MG capsule Take 1 capsule (100 mg total) by mouth 2 (two) times daily. To keep stools soft 30 capsule 0    furosemide  (LASIX ) 20 MG tablet Take 1 tablet (20 mg total) by mouth daily for 2 days, THEN 0.5 tablets (10 mg  total) daily for 2 days. 3 tablet 0    gabapentin  (NEURONTIN ) 300 MG capsule Take 1 capsule (300 mg total) by mouth 3 (three) times daily. 90 capsule 2    oxyCODONE  (OXY IR/ROXICODONE ) 5 MG immediate release tablet Take 1 tablet (5 mg total) by mouth every 4 (four) hours as needed for severe pain. 8 tablet 0      No Known Allergies   Past Medical History:  Diagnosis Date   Anxiety    Complication of anesthesia 03/24/2022   Post-obstruction pulmonary edema requiring admission with supplemental oxygen   Depression    Morbid obesity with BMI of 45.0-49.9, adult (HCC)    Thyroid  disease     Review of systems:  Otherwise negative.    Physical Exam  Gen: Alert, oriented. Appears stated age.  HEENT: [place]/AT. PERRLA. Lungs: CTA, no wheezes. CV: RR nl S1, S2. Abd: soft, benign, no masses. BS+ Ext: No edema. Pulses 2+    Planned procedures: Proceed with EGD.  The patient understands the nature of the planned procedure, indications, risks, alternatives and potential complications including but not limited to bleeding, infection, perforation, damage to internal organs and possible oversedation/side effects from anesthesia. The patient agrees and gives consent to proceed.  Please refer to procedure notes for findings, recommendations and patient disposition/instructions.     Tamara Harrison K. Aundria, M.D. Gastroenterology 06/04/2024  11:54 AM

## 2024-06-04 NOTE — Transfer of Care (Signed)
 Immediate Anesthesia Transfer of Care Note  Patient: Tamara Harrison  Procedure(s) Performed: EGD (ESOPHAGOGASTRODUODENOSCOPY)  Patient Location: PACU and Endoscopy Unit  Anesthesia Type:MAC  Level of Consciousness: awake  Airway & Oxygen Therapy: Patient Spontanous Breathing  Post-op Assessment: Report given to RN and Post -op Vital signs reviewed and stable  Post vital signs: Reviewed and stable  Last Vitals:  Vitals Value Taken Time  BP    Temp    Pulse 70 06/04/24 12:11  Resp    SpO2 100 % 06/04/24 12:11  Vitals shown include unfiled device data.  Last Pain:  Vitals:   06/04/24 1024  TempSrc: Temporal  PainSc: 0-No pain         Complications: No notable events documented.

## 2024-06-04 NOTE — Op Note (Signed)
 [facility] Gastroenterology Patient Name: Tamara Harrison Procedure Date: 06/04/2024 12:03 PM MRN: [id number] Account #: [id number] Date of Birth: [birth year] Admit Type: Ambulatory Age: 28 Room: [facility] ENDO ROOM 2 Gender: Female Note Status: Finalized Instrument Name: Barnie GI Scope [id number] Procedure:             Upper GI endoscopy Indications:           Epigastric abdominal pain, Suspected gastro-esophageal                         reflux disease Providers:             Bridgid Printz K. Sameerah Nachtigal MD, MD Referring MD:          no Medicines:             Propofol  per Anesthesia Complications:         No immediate complications. Estimated blood loss:                         Minimal. Procedure:             Pre-Anesthesia Assessment:                        - The risks and benefits of the procedure and the                         sedation options and risks were discussed with the                         patient. All questions were answered and informed                         consent was obtained.                        - Patient identification and proposed procedure were                         verified prior to the procedure by the nurse. The                         procedure was verified in the procedure room.                        - ASA Grade Assessment: III - A patient with severe                         systemic disease.                        - After reviewing the risks and benefits, the patient                         was deemed in satisfactory condition to undergo the                         procedure.                        After obtaining informed consent, the endoscope was  passed under direct vision. Throughout the procedure,                         the patient's blood pressure, pulse, and oxygen                         saturations were monitored continuously. The Endoscope                         was introduced through the mouth, and advanced to  the                         third part of duodenum. The upper GI endoscopy was                         accomplished without difficulty. The patient tolerated                         the procedure well. Findings:      The esophagus was normal.      Segmental mild inflammation characterized by nodularity was found in the       gastric antrum. Biopsies were taken with a cold forceps for histology.       Estimated blood loss was minimal.      The exam of the stomach was otherwise normal.      The cardia and gastric fundus were normal on retroflexion.      The examined duodenum was normal. Impression:            - Normal esophagus.                        - Gastritis. Biopsied.                        - Normal examined duodenum. Recommendation:        - Patient has a contact number available for                         emergencies. The signs and symptoms of potential                         delayed complications were discussed with the patient.                         Return to normal activities tomorrow. Written                         discharge instructions were provided to the patient.                        - Resume previous diet.                        - Continue present medications.                        - Await pathology results.                        - Proceed with HIDA scan tomorrow as  scheduled.                        - Return to my office in 1 month.                        - Telephone GI office to schedule appointment.                        - The findings and recommendations were discussed with                         the patient. Procedure Code(s):     --- Professional ---                        [phone], Esophagogastroduodenoscopy, flexible,                         transoral; with biopsy, single or multiple Diagnosis Code(s):     --- Professional ---                        R10.13, Epigastric pain                        K29.70, Gastritis, unspecified, without bleeding CPT  copyright 2022 American Medical Association. All rights reserved. The codes documented in this report are preliminary and upon coder review may  be revised to meet current compliance requirements. Ladell MARLA Boss MD, MD 06/04/2024 12:12:12 PM This report has been signed electronically. Number of Addenda: 0 Note Initiated On: 06/04/2024 12:03 PM Estimated Blood Loss:  Estimated blood loss was minimal.      [facility]

## 2024-06-04 NOTE — Anesthesia Postprocedure Evaluation (Signed)
 Anesthesia Post Note  Patient: Tamara Harrison  Procedure(s) Performed: EGD (ESOPHAGOGASTRODUODENOSCOPY)  Patient location during evaluation: Endoscopy Anesthesia Type: General Level of consciousness: awake and alert Pain management: pain level controlled Vital Signs Assessment: post-procedure vital signs reviewed and stable Respiratory status: spontaneous breathing, nonlabored ventilation, respiratory function stable and patient connected to nasal cannula oxygen Cardiovascular status: blood pressure returned to baseline and stable Postop Assessment: no apparent nausea or vomiting Anesthetic complications: no   No notable events documented.   Last Vitals:  Vitals:   06/04/24 1221 06/04/24 1231  BP: (!) 133/92 137/80  Pulse:    Resp:    Temp:    SpO2:      Last Pain:  Vitals:   06/04/24 1231  TempSrc:   PainSc: 0-No pain                 Lendia LITTIE Mae

## 2024-06-04 NOTE — Anesthesia Preprocedure Evaluation (Addendum)
 Anesthesia Evaluation  Patient identified by MRN, date of birth, ID band Patient awake    Reviewed: Allergy & Precautions, NPO status , Patient's Chart, lab work & pertinent test results  History of Anesthesia Complications (+) history of anesthetic complications (Post-obstruction pulmonary edema requiring admission with supplemental oxygen)  Airway Mallampati: III  TM Distance: >3 FB Neck ROM: full    Dental no notable dental hx.    Pulmonary neg pulmonary ROS   Pulmonary exam normal        Cardiovascular negative cardio ROS Normal cardiovascular exam     Neuro/Psych  PSYCHIATRIC DISORDERS Anxiety Depression    negative neurological ROS     GI/Hepatic negative GI ROS, Neg liver ROS,,,  Endo/Other  negative endocrine ROS    Renal/GU negative Renal ROS  negative genitourinary   Musculoskeletal   Abdominal   Peds  Hematology  (+) Blood dyscrasia, anemia   Anesthesia Other Findings Past Medical History: No date: Anxiety 03/24/2022: Complication of anesthesia     Comment:  Post-obstruction pulmonary edema requiring admission               with supplemental oxygen No date: Depression No date: Morbid obesity with BMI of 45.0-49.9, adult (HCC) No date: Thyroid  disease  Past Surgical History: 03/24/2022: LAPAROSCOPIC TUBAL LIGATION; Bilateral     Comment:  Procedure: LAPAROSCOPIC TUBAL LIGATION;  Surgeon:               Verdon Keen, MD;  Location: [facility] ORS;  Service:               Gynecology;  Laterality: Bilateral; No date: NO PAST SURGERIES  BMI    Body Mass Index: 38.09 kg/m      Reproductive/Obstetrics negative OB ROS                              Anesthesia Physical Anesthesia Plan  ASA: 2  Anesthesia Plan: General   Post-op Pain Management: Minimal or no pain anticipated   Induction: Intravenous  PONV Risk Score and Plan: Propofol  infusion and TIVA  Airway  Management Planned: Natural Airway and Nasal Cannula  Additional Equipment:   Intra-op Plan:   Post-operative Plan:   Informed Consent: I have reviewed the patients History and Physical, chart, labs and discussed the procedure including the risks, benefits and alternatives for the proposed anesthesia with the patient or authorized representative who has indicated his/her understanding and acceptance.     Dental Advisory Given  Plan Discussed with: Anesthesiologist, CRNA and Surgeon  Anesthesia Plan Comments: (Patient consented for risks of anesthesia including but not limited to:  - adverse reactions to medications - risk of airway placement if required - damage to eyes, teeth, lips or other oral mucosa - nerve damage due to positioning  - sore throat or hoarseness - Damage to heart, brain, nerves, lungs, other parts of body or loss of life  Patient voiced understanding and assent.)         Anesthesia Quick Evaluation

## 2024-06-05 ENCOUNTER — Ambulatory Visit
Admission: RE | Admit: 2024-06-05 | Discharge: 2024-06-05 | Disposition: A | Discharge status: Home / Self Care | Payer: MEDICAID | Source: Ambulatory Visit | Attending: Internal Medicine | Admitting: Internal Medicine

## 2024-06-05 ENCOUNTER — Encounter: Payer: Self-pay | Admitting: Radiology

## 2024-06-05 DIAGNOSIS — K8020 Calculus of gallbladder without cholecystitis without obstruction: Secondary | ICD-10-CM | POA: Insufficient documentation

## 2024-06-05 DIAGNOSIS — R1013 Epigastric pain: Secondary | ICD-10-CM | POA: Diagnosis present

## 2024-06-05 MED ORDER — TECHNETIUM TC 99M MEBROFENIN IV KIT
5.0000 | PACK | Freq: Once | INTRAVENOUS | Status: AC | PRN
  Administered 2024-06-05: 5.23 via INTRAVENOUS

## 2024-06-06 LAB — SURGICAL PATHOLOGY

## 2024-06-10 ENCOUNTER — Ambulatory Visit: Payer: Self-pay | Admitting: Surgery

## 2024-06-10 NOTE — H&P (Signed)
 Subjective:    CC: Biliary dyskinesia [K82.8]   HPI:  referred by Ladell Francis Boss, MD for evaluation of above CC.    History of Present Illness Tamara Harrison is a 28 year old female who presents with right upper quadrant and epigastric pain.   She experiences pain in the right upper quadrant and primarily in the epigastric region, radiating to her back. The pain worsens with the consumption of fried fatty foods. There is no indigestion, nausea, vomiting, bloating, gas, diarrhea, or jaundice. A HIDA scan shows a 3% ejection fraction.     Past Medical History:  has a past medical history of Anxiety, Depression, and PTSD (post-traumatic stress disorder).   Past Surgical History:  has a past surgical history that includes Tubal ligation (03/24/2022).   Family History: family history is not on file.   Social History:  reports that she has never smoked. She has never used smokeless tobacco. She reports that she does not currently use alcohol. She reports that she does not use drugs.   Current Medications: has a current medication list which includes the following prescription(s): paroxetine , semaglutide, and paroxetine .   Allergies:     Allergies as of 06/10/2024   (No Known Allergies)      ROS:  A 15 point review of systems was performed and pertinent positives and negatives noted in HPI    Objective:    Objective BP 112/65   Pulse 62   Ht 167.6 cm (5' 6)   Wt (!) 106.1 kg (234 lb)   LMP 05/13/2024   BMI 37.77 kg/m      Constitutional :  No distress, cooperative, alert  Lymphatics/Throat:  Supple with no lymphadenopathy  Respiratory:  Clear to auscultation bilaterally  Cardiovascular:  Regular rate and rhythm  Gastrointestinal: Soft, non-tender, non-distended, no organomegaly.  Musculoskeletal: Steady gait and movement  Skin: Cool and moist  Psychiatric: Normal affect, non-agitated, not confused         LABS:  N/a    RADS: CLINICAL DATA:  Abdominal pain.   Cholelithiasis.   EXAM:  NUCLEAR MEDICINE HEPATOBILIARY IMAGING WITH GALLBLADDER EF   TECHNIQUE:  Sequential images of the abdomen were obtained out to 60 minutes  following intravenous administration of radiopharmaceutical. After  oral ingestion of Ensure, gallbladder ejection fraction was  determined. At 60 min, normal ejection fraction is greater than 33%.   RADIOPHARMACEUTICALS:  5.2 mCi Tc-53m  Choletec  IV   COMPARISON:  None Available.   FINDINGS:  Prompt uptake and biliary excretion of activity by the liver is  seen. Gallbladder activity is visualized, consistent with patency of  cystic duct. Biliary activity passes into small bowel, consistent  with patent common bile duct.   Calculated gallbladder ejection fraction is 3%. (Normal gallbladder  ejection fraction with Ensure is greater than 33% and less than  80%.)   IMPRESSION:  Normal hepatobiliary scan, demonstrating patency of both the cystic  and common bile ducts.   Decreased gallbladder ejection fraction of 3%, which can be seen  with biliary dyskinesia.    Electronically Signed    By: Norleen DELENA Kil M.D.    On: 06/06/2024 10:40    CLINICAL DATA:  epigastric pain/ruq pain   EXAM:  ABDOMEN ULTRASOUND COMPLETE   COMPARISON:  Feb 18, 2020.   FINDINGS:  Gallbladder: Shadowing cholelithiasis. No wall thickening  visualized. No sonographic Murphy sign noted by sonographer.   Common bile duct: Diameter: Visualized portion measures 4 mm, within  normal limits.  Liver: No focal lesion identified. Previously described possible  echogenic mass is not discretely visualized on today's exam.  Diffusely mildly increased in parenchymal echogenicity. Portal vein  is patent on color Doppler imaging with normal direction of blood  flow towards the liver.   IVC: No abnormality visualized.   Pancreas: Visualized portion unremarkable.   Spleen: Size and appearance within normal limits.   Right Kidney: Length: 12.4  cm. Echogenicity within normal limits. No  mass or hydronephrosis visualized.   Left Kidney: Length: 11.5 cm. Echogenicity within normal limits. No  mass or hydronephrosis visualized.   Abdominal aorta: No aneurysm visualized. Portions are not visualized  secondary to shadowing bowel gas.   Other findings: None.   IMPRESSION:  1. Cholelithiasis without sonographic evidence of acute  cholecystitis.  2. Mildly increased hepatic parenchymal echogenicity as can be seen  in hepatic steatosis.  Assessment:    Assessment Biliary dyskinesia [K82.8]- symptoms consistent as well so will proceed   Plan:    Plan 1. Biliary dyskinesia [K82.8] Discussed the risk of surgery including post-op infxn, seroma, biloma, chronic pain, poor-delayed wound healing, retained gallstone, conversion to open procedure, post-op SBO or ileus, and need for additional procedures to address said risks.  The risks of general anesthetic including MI, CVA, sudden death or even reaction to anesthetic medications also discussed. Alternatives include continued observation.  Benefits include possible symptom relief, prevention of complications including acute cholecystitis, pancreatitis.   Typical post operative recovery of 3-5 days rest, continued pain in area and incision sites, possible loose stools up to 4-6 weeks, also discussed.   ED return precautions given for sudden increase in RUQ pain, with possible accompanying fever, nausea, and/or vomiting.   The patient understands the risks, any and all questions were answered to the patient's satisfaction.   2. Biliary dyskinesia [K82.8]. Will proceed with robotic assisted laparoscopic cholecystectomy [phone]   labs/images/medications/previous chart entries reviewed personally and relevant changes/updates noted above.

## 2024-06-20 ENCOUNTER — Emergency Department: Payer: MEDICAID

## 2024-06-20 DIAGNOSIS — K8050 Calculus of bile duct without cholangitis or cholecystitis without obstruction: Secondary | ICD-10-CM | POA: Insufficient documentation

## 2024-06-20 DIAGNOSIS — R1011 Right upper quadrant pain: Secondary | ICD-10-CM | POA: Diagnosis present

## 2024-06-20 LAB — COMPREHENSIVE METABOLIC PANEL WITH GFR
ALT: 15 U/L (ref 0–44)
AST: 18 U/L (ref 15–41)
Albumin: 3.8 g/dL (ref 3.5–5.0)
Alkaline Phosphatase: 79 U/L (ref 38–126)
Anion gap: 9 (ref 5–15)
BUN: 17 mg/dL (ref 6–20)
CO2: 25 mmol/L (ref 22–32)
Calcium: 8.8 mg/dL — ABNORMAL LOW (ref 8.9–10.3)
Chloride: 106 mmol/L (ref 98–111)
Creatinine, Ser: 0.63 mg/dL (ref 0.44–1.00)
GFR, Estimated: >60 mL/min (ref >60)
Glucose, Bld: 74 mg/dL (ref 70–99)
Potassium: 3.7 mmol/L (ref 3.5–5.1)
Sodium: 140 mmol/L (ref 135–145)
Total Bilirubin: 0.4 mg/dL (ref 0.0–1.2)
Total Protein: 7.4 g/dL (ref 6.5–8.1)

## 2024-06-20 LAB — CBC
HCT: 39.8 % (ref 36.0–46.0)
Hemoglobin: 13.3 g/dL (ref 12.0–15.0)
MCH: 28.7 pg (ref 26.0–34.0)
MCHC: 33.4 g/dL (ref 30.0–36.0)
MCV: 86 fL (ref 80.0–100.0)
Platelets: 404 K/uL — ABNORMAL HIGH (ref 150–400)
RBC: 4.63 MIL/uL (ref 3.87–5.11)
RDW: 12.8 % (ref 11.5–15.5)
WBC: 9.2 K/uL (ref 4.0–10.5)
nRBC: 0 % (ref 0.0–0.2)

## 2024-06-20 LAB — LIPASE, BLOOD: Lipase: 32 U/L (ref 11–51)

## 2024-06-20 NOTE — ED Triage Notes (Signed)
 Pt states that she has been having gallbladder pain with nausea, states that she has a surgery scheduled for the 10th but pain is unbearable

## 2024-06-21 ENCOUNTER — Emergency Department
Admission: EM | Admit: 2024-06-21 | Discharge: 2024-06-21 | Disposition: A | Discharge status: Home / Self Care | Payer: MEDICAID | Attending: Emergency Medicine | Admitting: Emergency Medicine

## 2024-06-21 DIAGNOSIS — K8050 Calculus of bile duct without cholangitis or cholecystitis without obstruction: Secondary | ICD-10-CM

## 2024-06-21 DIAGNOSIS — K8020 Calculus of gallbladder without cholecystitis without obstruction: Secondary | ICD-10-CM

## 2024-06-21 LAB — URINALYSIS, ROUTINE W REFLEX MICROSCOPIC
Bilirubin Urine: NEGATIVE
Glucose, UA: NEGATIVE mg/dL
Hgb urine dipstick: NEGATIVE
Ketones, ur: NEGATIVE mg/dL
Nitrite: NEGATIVE
Protein, ur: NEGATIVE mg/dL
Specific Gravity, Urine: 1.028 (ref 1.005–1.030)
pH: 5 (ref 5.0–8.0)

## 2024-06-21 MED ORDER — DICYCLOMINE HCL 10 MG PO CAPS: 10.0000 mg | ORAL_CAPSULE | Freq: Three times a day (TID) | ORAL | 0 refills | Status: AC | PRN

## 2024-06-21 MED ORDER — ONDANSETRON 4 MG PO TBDP
4.0000 mg | ORAL_TABLET | Freq: Once | ORAL | Status: AC
  Administered 2024-06-21: 4 mg via ORAL
  Filled 2024-06-21: qty 1

## 2024-06-21 MED ORDER — OXYCODONE-ACETAMINOPHEN 5-325 MG PO TABS: 2.0000 | ORAL_TABLET | Freq: Four times a day (QID) | ORAL | 0 refills | Status: DC | PRN

## 2024-06-21 MED ORDER — ONDANSETRON 4 MG PO TBDP: ORAL_TABLET | ORAL | 0 refills | Status: AC

## 2024-06-21 MED ORDER — DOCUSATE SODIUM 100 MG PO CAPS: ORAL_CAPSULE | ORAL | 0 refills | Status: AC

## 2024-06-21 MED ORDER — OXYCODONE-ACETAMINOPHEN 5-325 MG PO TABS
1.0000 | ORAL_TABLET | Freq: Once | ORAL | Status: AC
  Administered 2024-06-21: 1 via ORAL
  Filled 2024-06-21: qty 1

## 2024-06-21 MED ORDER — DICYCLOMINE HCL 10 MG PO CAPS
20.0000 mg | ORAL_CAPSULE | Freq: Once | ORAL | Status: AC
Start: 2024-06-21 — End: 2024-06-21
  Administered 2024-06-21: 20 mg via ORAL
  Filled 2024-06-21: qty 2

## 2024-06-21 NOTE — Discharge Instructions (Addendum)
 You have been seen in the Emergency Department (ED) for abdominal pain.  Your evaluation suggests that your pain is caused by gallstones.  You do not need immediate surgery at this time, and the surgeons will want to continue with your surgery as scheduled.  We will recommend, however, that you call the office of Dr. Tye first thing on Monday morning and explained that you are having symptoms every day and ask if there is any way to schedule the appointment for sooner than it is currently planned.    Read through the included information about a bland diet, and use any prescribed medications as instructed.  Avoid smoking and alcohol use.  Please follow up as instructed above regarding today's emergent visit and the symptoms that are bothering you.  Please take the medications you were prescribed, but also please understand that the emergency department will not continue to provide medications for pain.  This should come from your primary care doctor or from your surgeon.  Take Percocet as prescribed. Do not drink alcohol, drive or participate in any other potentially dangerous activities while taking this medication as it may make you sleepy. Do not take this medication with any other sedating medications, either prescription or over-the-counter. If you were prescribed Percocet or Vicodin, do not take these with acetaminophen  (Tylenol ) as it is already contained within these medications.   This medication is an opiate (or narcotic) pain medication and can be habit forming.  Use it as little as possible to achieve adequate pain control.  Do not use or use it with extreme caution if you have a history of opiate abuse or dependence.  If you are on a pain contract with your primary care doctor or a pain specialist, be sure to let them know you were prescribed this medication today from the [facility].  This medication is intended for your use only - do not give any to anyone else  and keep it in a secure place where nobody else, especially children, have access to it.  It will also cause or worsen constipation, so you may want to consider taking an over-the-counter stool softener while you are taking this medication.  Return to the ED if your abdominal pain worsens or fails to improve, you develop bloody vomiting, bloody diarrhea, you are unable to tolerate fluids due to vomiting, fever greater than 101, or other symptoms that concern you.

## 2024-06-21 NOTE — ED Provider Notes (Signed)
 [facility] Provider Note    Event Date/Time   First MD Initiated Contact with Patient 06/21/24 0028     (approximate)   History   Cholelithiasis   HPI Tamara Harrison is a 28 y.o. female who presents for persistent episodes of upper middle and right upper quadrant abdominal pain.  She said that she knows she has gallstones and has seen Dr. Tye with surgery about 2 weeks ago.  The plan is for surgery to remove her gallbladder as an outpatient on December 10 (approximately 20 days from now), but she wants to get it out sooner because she is tired of having episodes of pain.  She said that no matter what she eats it hurts.  She sometimes has nausea but no vomiting.  She said the symptoms she feels constipated as well and then other times her stools are loose.  No fever.  Symptoms have been persistent for weeks or longer.  She is frustrated.     Physical Exam   Triage Vital Signs: ED Triage Vitals [06/20/24 2028]  Encounter Vitals Group     BP 128/84     Girls Systolic BP Percentile      Girls Diastolic BP Percentile      Boys Systolic BP Percentile      Boys Diastolic BP Percentile      Pulse Rate 73     Resp 16     Temp 98 F (36.7 C)     Temp Source Oral     SpO2 98 %     Weight 104.8 kg (231 lb)     Height      Head Circumference      Peak Flow      Pain Score 10     Pain Loc      Pain Education      Exclude from Growth Chart     Most recent vital signs: Vitals:   06/20/24 2028 06/21/24 0031  BP: 128/84 118/65  Pulse: 73 60  Resp: 16 16  Temp: 98 F (36.7 C) (!) 97.5 F (36.4 C)  SpO2: 98% 98%    General: Awake, no  Obvious distress. CV:  Good peripheral perfusion.  Regular rate. Resp:  Normal effort. Speaking easily and comfortably, no accessory muscle usage nor intercostal retractions.   Abd:  Obese.  Abdomen is soft and nondistended.  She has no tenderness to palpation throughout the abdomen including in the epigastrium and  right upper quadrant, and she has no guarding and negative Murphy sign.   ED Results / Procedures / Treatments   Labs (all labs ordered are listed, but only abnormal results are displayed) Labs Reviewed  COMPREHENSIVE METABOLIC PANEL WITH GFR - Abnormal; Notable for the following components:      Result Value   Calcium 8.8 (*)    All other components within normal limits  CBC - Abnormal; Notable for the following components:   Platelets 404 (*)    All other components within normal limits  URINALYSIS, ROUTINE W REFLEX MICROSCOPIC - Abnormal; Notable for the following components:   Color, Urine YELLOW (*)    APPearance CLOUDY (*)    Leukocytes,Ua SMALL (*)    Bacteria, UA RARE (*)    All other components within normal limits  URINE CULTURE  LIPASE, BLOOD  POC URINE PREG, ED     RADIOLOGY I independently viewed and interpreted the patient's right upper quadrant ultrasound and I can appreciate gallstones but with  no evidence of cholecystitis.  This is confirmed by radiology.   PROCEDURES:  Critical Care performed: No  Procedures    IMPRESSION / MDM / ASSESSMENT AND PLAN / ED COURSE  I reviewed the triage vital signs and the nursing notes.                              Differential diagnosis includes, but is not limited to, gallbladder disease including the possibility of cholecystitis and choledocholithiasis, biliary colic, SBO/ileus.  Patient's presentation is most consistent with acute presentation with potential threat to life or bodily function.  Labs/studies ordered: Urinalysis (pending at time of discharge), CMP, lipase, CBC, ultrasound  Interventions/Medications given:  Medications  dicyclomine  (BENTYL ) capsule 20 mg (20 mg Oral Given 06/21/24 0115)  oxyCODONE -acetaminophen  (PERCOCET/ROXICET) 5-325 MG per tablet 1 tablet (1 tablet Oral Given 06/21/24 0115)  ondansetron  (ZOFRAN -ODT) disintegrating tablet 4 mg (4 mg Oral Given 06/21/24 0115)    (Note:  hospital  course my include additional interventions and/or labs/studies not listed above.)   Patient has stable vital signs within normal limits.  No evidence of biliary obstruction based on no elevated LFTs including T. bili.  Lipase is normal.  Patient has absolutely no tenderness to palpation on physical exam with no guarding.  Ultrasound shows cholelithiasis without cholecystitis.  I explained that her exam is reassuring and unfortunately there is no way that the surgeon will take her emergently over the weekend for a cholecystectomy.  She said that she understands.  I encouraged her to call the office of Dr. Tye Monday morning and asked to see if there is any opening for her to have the surgery sooner.  I provided medications listed above and prescriptions listed below but explained this is not going to happen again and that the emergency department will not refill pain medications.  I encouraged her to follow-up as an outpatient and she understands and agrees with plan.  The patient's medical screening exam is reassuring with no indication of an emergent medical condition requiring hospitalization or additional evaluation at this point.  The patient is safe and appropriate for discharge and outpatient follow up.     Clinical Course as of 06/21/24 0134  Sat Jun 21, 2024  0133 Urinalysis, Routine w reflex microscopic -Urine, Clean Catch(!) The patient's urinalysis is cloudy with some small leukocytes and WBCs but also with significant squamous epithelials.  She has no urinary symptoms so I am sending a urine culture but will not treat empirically. [CF]    Clinical Course User Index [CF] Gordan Huxley, MD     FINAL CLINICAL IMPRESSION(S) / ED DIAGNOSES   Final diagnoses:  Biliary colic  Calculus of gallbladder without cholecystitis without obstruction     Rx / DC Orders   ED Discharge Orders          Ordered    dicyclomine  (BENTYL ) 10 MG capsule  3 times daily PRN        06/21/24 0057     ondansetron  (ZOFRAN -ODT) 4 MG disintegrating tablet        06/21/24 0057    oxyCODONE -acetaminophen  (PERCOCET) 5-325 MG tablet  Every 6 hours PRN        06/21/24 0057    docusate sodium  (COLACE) 100 MG capsule        06/21/24 0057             Note:  This document was prepared using Dragon voice recognition  software and may include unintentional dictation errors.   Gordan Huxley, MD 06/21/24 [phone]

## 2024-06-21 NOTE — ED Notes (Signed)
 Pt given DC instructions. Pt verbalized understanding of medications and follow up care. Pt ambulatory from ED without difficulty.

## 2024-06-21 NOTE — ED Notes (Signed)
 Pt states unable to provide a clean catch urine specimen currently.

## 2024-06-22 LAB — URINE CULTURE: Culture: <10000 — AB

## 2024-07-04 ENCOUNTER — Encounter
Admission: RE | Admit: 2024-07-04 | Discharge: 2024-07-04 | Disposition: A | Discharge status: Home / Self Care | Payer: MEDICAID | Source: Ambulatory Visit | Attending: Surgery | Admitting: Surgery

## 2024-07-04 ENCOUNTER — Other Ambulatory Visit: Payer: Self-pay

## 2024-07-04 DIAGNOSIS — Z01812 Encounter for preprocedural laboratory examination: Secondary | ICD-10-CM

## 2024-07-04 HISTORY — DX: Gastro-esophageal reflux disease without esophagitis: K21.9

## 2024-07-04 HISTORY — DX: Anemia, unspecified: D64.9

## 2024-07-04 HISTORY — DX: Sleep apnea, unspecified: G47.30

## 2024-07-04 HISTORY — DX: Other specified diseases of gallbladder: K82.8

## 2024-07-04 NOTE — Patient Instructions (Addendum)
 Your procedure is scheduled on: 07/11/24 - Friday Report to the Registration Desk on the 1st floor of the [facility]. To find out your arrival time, please call [phone] between 1PM - 3PM on: 07/10/24 - Thursday If your arrival time is 6:00 am, do not arrive before that time as the [facility] entrance doors do not open until 6:00 am.  REMEMBER: Instructions that are not followed completely may result in serious medical risk, up to and including death; or upon the discretion of your surgeon and anesthesiologist your surgery may need to be rescheduled.  Do not eat food after midnight the night before surgery.  No gum chewing or hard candies.  You may however, drink CLEAR liquids up to 2 hours before you are scheduled to arrive for your surgery. Do not drink anything within 2 hours of your scheduled arrival time.  Clear liquids include: - water  - apple juice without pulp - gatorade (not RED colors) - black coffee or tea (Do NOT add milk or creamers to the coffee or tea) Do NOT drink anything that is not on this list.  One week prior to surgery: Stop Anti-inflammatories (NSAIDS) such as diclofenac (VOLTAREN) ,Advil , Aleve, Ibuprofen , Motrin , Naproxen, Naprosyn and Aspirin based products such as Excedrin, Goody's Powder, BC Powder. You may take Tylenol  if needed for pain up until the day of surgery.  Stop ANY OVER THE COUNTER supplements until after surgery.  semaglutide-weight management [facility]) - hold 7 days prior to your surgery.  ON THE DAY OF SURGERY ONLY TAKE THESE MEDICATIONS WITH SIPS OF WATER:  omeprazole (PRILOSEC)  PARoxetine  (PAXIL )   Bring your CPAP with you on the day of surgery.  No Alcohol for 24 hours before or after surgery.  No Smoking including e-cigarettes for 24 hours before surgery.  No chewable tobacco products for at least 6 hours before surgery.  No nicotine patches on the day of surgery.  Do not use any recreational drugs for at least a week  (preferably 2 weeks) before your surgery.  Please be advised that the combination of cocaine and anesthesia may have negative outcomes, up to and including death. If you test positive for cocaine, your surgery will be cancelled.  On the morning of surgery brush your teeth with toothpaste and water, you may rinse your mouth with mouthwash if you wish. Do not swallow any toothpaste or mouthwash.  Use CHG Soap or wipes as directed on instruction sheet.  Do not wear jewelry, make-up, hairpins, clips or nail polish.  For welded (permanent) jewelry: bracelets, anklets, waist bands, etc.  Please have this removed prior to surgery.  If it is not removed, there is a chance that hospital personnel will need to cut it off on the day of surgery.  Do not wear lotions, powders, or perfumes.   Do not shave body hair from the neck down 48 hours before surgery.  Contact lenses, hearing aids and dentures may not be worn into surgery.  Do not bring valuables to the hospital. [facility] is not responsible for any missing/lost belongings or valuables.   Notify your doctor if there is any change in your medical condition (cold, fever, infection).  Wear comfortable clothing (specific to your surgery type) to the hospital.  After surgery, you can help prevent lung complications by doing breathing exercises.  Take deep breaths and cough every 1-2 hours. Your doctor may order a device called an Incentive Spirometer to help you take deep breaths.  When coughing  or sneezing, hold a pillow firmly against your incision with both hands. This is called "splinting." Doing this helps protect your incision. It also decreases belly discomfort.  If you are being admitted to the hospital overnight, leave your suitcase in the car. After surgery it may be brought to your room.  In case of increased patient census, it may be necessary for you, the patient, to continue your postoperative care in the Same Day Surgery  department.  If you are being discharged the day of surgery, you will not be allowed to drive home. You will need a responsible individual to drive you home and stay with you for 24 hours after surgery.   If you are taking public transportation, you will need to have a responsible individual with you.  Please call the Pre-admissions Testing Dept. at [phone] if you have any questions about these instructions.  Surgery Visitation Policy:  Patients having surgery or a procedure may have two visitors.  Children under the age of 63 must have an adult with them who is not the patient.  Inpatient Visitation:    Visiting hours are 7 a.m. to 8 p.m. Up to four visitors are allowed at one time in a patient room. The visitors may rotate out with other people during the day.  One visitor age 21 or older may stay with the patient overnight and must be in the room by 8 p.m.   Merchandiser, retail to address health-related social needs:  [email or web address]                                                                                                            Preparing for Surgery with CHLORHEXIDINE  GLUCONATE (CHG) Soap  Chlorhexidine  Gluconate (CHG) Soap  o An antiseptic cleaner that kills germs and bonds with the skin to continue killing germs even after washing  o Used for showering the night before surgery and morning of surgery  Before surgery, you can play an important role by reducing the number of germs on your skin.  CHG (Chlorhexidine  gluconate) soap is an antiseptic cleanser which kills germs and bonds with the skin to continue killing germs even after washing.  Please do not use if you have an allergy to CHG or antibacterial soaps. If your skin becomes reddened/irritated stop using the CHG.  1. Shower the NIGHT BEFORE SURGERY with CHG soap.  2. If you choose to wash your hair, wash your hair first as usual with your normal shampoo.  3. After  shampooing, rinse your hair and body thoroughly to remove the shampoo.  4. Use CHG as you would any other liquid soap. You can apply CHG directly to the skin and wash gently with a clean washcloth.  5. Apply the CHG soap to your body only from the neck down. Do not use on open wounds or open sores. Avoid contact with your eyes, ears, mouth, and genitals (private parts). Wash face and genitals (private parts) with your normal soap.  6. Wash thoroughly, paying special attention to the area  where your surgery will be performed.  7. Thoroughly rinse your body with warm water.  8. Do not shower/wash with your normal soap after using and rinsing off the CHG soap.  9. Do not use lotions, oils, etc., after showering with CHG.  10. Pat yourself dry with a clean towel.  11. Wear clean pajamas to bed the night before surgery.  12. Place clean sheets on your bed the night of your shower and do not sleep with pets.  13. Do not apply any deodorants/lotions/powders.  14. Please wear clean clothes to the hospital.  15. Remember to brush your teeth with your regular toothpaste.

## 2024-07-11 ENCOUNTER — Ambulatory Visit: Payer: MEDICAID | Admitting: Anesthesiology

## 2024-07-11 ENCOUNTER — Other Ambulatory Visit: Payer: Self-pay

## 2024-07-11 ENCOUNTER — Encounter: Admission: RE | Disposition: A | Payer: Self-pay | Source: Home / Self Care | Attending: Surgery

## 2024-07-11 ENCOUNTER — Encounter: Payer: Self-pay | Admitting: Surgery

## 2024-07-11 ENCOUNTER — Ambulatory Visit
Admission: RE | Admit: 2024-07-11 | Discharge: 2024-07-11 | Disposition: A | Discharge status: Home / Self Care | Payer: MEDICAID | Attending: Surgery | Admitting: Surgery

## 2024-07-11 DIAGNOSIS — K828 Other specified diseases of gallbladder: Secondary | ICD-10-CM | POA: Insufficient documentation

## 2024-07-11 DIAGNOSIS — G4730 Sleep apnea, unspecified: Secondary | ICD-10-CM | POA: Insufficient documentation

## 2024-07-11 DIAGNOSIS — K8010 Calculus of gallbladder with chronic cholecystitis without obstruction: Secondary | ICD-10-CM | POA: Insufficient documentation

## 2024-07-11 DIAGNOSIS — K219 Gastro-esophageal reflux disease without esophagitis: Secondary | ICD-10-CM | POA: Insufficient documentation

## 2024-07-11 DIAGNOSIS — F419 Anxiety disorder, unspecified: Secondary | ICD-10-CM | POA: Diagnosis not present

## 2024-07-11 DIAGNOSIS — F32A Depression, unspecified: Secondary | ICD-10-CM | POA: Diagnosis not present

## 2024-07-11 LAB — POCT PREGNANCY, URINE: Preg Test, Ur: NEGATIVE

## 2024-07-11 SURGERY — CHOLECYSTECTOMY, ROBOT-ASSISTED, LAPAROSCOPIC
Anesthesia: General | Site: Abdomen

## 2024-07-11 MED ORDER — LIDOCAINE HCL (PF) 2 % IJ SOLN
INTRAMUSCULAR | Status: DC | PRN
  Administered 2024-07-11: 100 mg via INTRADERMAL

## 2024-07-11 MED ORDER — INDOCYANINE GREEN 25 MG IV SOLR
INTRAVENOUS | Status: AC
Start: 2024-07-11 — End: 2024-07-11
  Filled 2024-07-11: qty 10

## 2024-07-11 MED ORDER — CHLORHEXIDINE GLUCONATE 0.12 % MT SOLN
15.0000 mL | Freq: Once | OROMUCOSAL | Status: AC
  Administered 2024-07-11: 15 mL via OROMUCOSAL

## 2024-07-11 MED ORDER — LACTATED RINGERS IV SOLN: INTRAVENOUS | Status: DC

## 2024-07-11 MED ORDER — MIDAZOLAM HCL 2 MG/2ML IJ SOLN
INTRAMUSCULAR | Status: DC | PRN
Start: 2024-07-11 — End: 2024-07-11
  Administered 2024-07-11: 2 mg via INTRAVENOUS

## 2024-07-11 MED ORDER — DEXMEDETOMIDINE HCL IN NACL 80 MCG/20ML IV SOLN
INTRAVENOUS | Status: DC | PRN
  Administered 2024-07-11: 12 ug via INTRAVENOUS

## 2024-07-11 MED ORDER — INDOCYANINE GREEN 25 MG IV SOLR
INTRAVENOUS | Status: AC
  Filled 2024-07-11: qty 10

## 2024-07-11 MED ORDER — VASOPRESSIN 20 UNIT/ML IV SOLN
INTRAVENOUS | Status: DC | PRN
  Administered 2024-07-11 (×2): 1 [IU] via INTRAVENOUS

## 2024-07-11 MED ORDER — CALCIUM CHLORIDE 10 % IV SOLN
INTRAVENOUS | Status: DC | PRN
Start: 2024-07-11 — End: 2024-07-11
  Administered 2024-07-11: 300 mg via INTRAVENOUS

## 2024-07-11 MED ORDER — OXYCODONE HCL 5 MG/5ML PO SOLN: 5.0000 mg | Freq: Once | ORAL | Status: AC | PRN

## 2024-07-11 MED ORDER — ACETAMINOPHEN 10 MG/ML IV SOLN
INTRAVENOUS | Status: DC | PRN
  Administered 2024-07-11: 1000 mg via INTRAVENOUS

## 2024-07-11 MED ORDER — FENTANYL CITRATE (PF) 100 MCG/2ML IJ SOLN
25.0000 ug | INTRAMUSCULAR | Status: DC | PRN
  Administered 2024-07-11 (×4): 25 ug via INTRAVENOUS

## 2024-07-11 MED ORDER — PHENYLEPHRINE HCL-NACL 20-0.9 MG/250ML-% IV SOLN
INTRAVENOUS | Status: DC | PRN
  Administered 2024-07-11: 50 ug/min via INTRAVENOUS

## 2024-07-11 MED ORDER — PROPOFOL 1000 MG/100ML IV EMUL
INTRAVENOUS | Status: AC
  Filled 2024-07-11: qty 100

## 2024-07-11 MED ORDER — OXYCODONE HCL 5 MG PO TABS
5.0000 mg | ORAL_TABLET | Freq: Four times a day (QID) | ORAL | 0 refills | Status: AC | PRN
  Filled 2024-07-11: qty 15, 4d supply, fill #0

## 2024-07-11 MED ORDER — GLYCOPYRROLATE 0.2 MG/ML IJ SOLN
INTRAMUSCULAR | Status: DC | PRN
Start: 2024-07-11 — End: 2024-07-11
  Administered 2024-07-11: .2 mg via INTRAVENOUS

## 2024-07-11 MED ORDER — PHENYLEPHRINE HCL-NACL 20-0.9 MG/250ML-% IV SOLN
INTRAVENOUS | Status: AC
Start: 2024-07-11 — End: 2024-07-11
  Filled 2024-07-11: qty 250

## 2024-07-11 MED ORDER — FENTANYL CITRATE (PF) 100 MCG/2ML IJ SOLN
INTRAMUSCULAR | Status: AC
  Filled 2024-07-11: qty 2

## 2024-07-11 MED ORDER — ORAL CARE MOUTH RINSE: 15.0000 mL | Freq: Once | OROMUCOSAL | Status: AC

## 2024-07-11 MED ORDER — CEFAZOLIN SODIUM-DEXTROSE 2-4 GM/100ML-% IV SOLN
INTRAVENOUS | Status: AC
  Filled 2024-07-11: qty 100

## 2024-07-11 MED ORDER — SUGAMMADEX SODIUM 200 MG/2ML IV SOLN
INTRAVENOUS | Status: DC | PRN
Start: 2024-07-11 — End: 2024-07-11
  Administered 2024-07-11: 200 mg via INTRAVENOUS

## 2024-07-11 MED ORDER — ONDANSETRON HCL 4 MG/2ML IJ SOLN: 4.0000 mg | Freq: Once | INTRAMUSCULAR | Status: DC | PRN

## 2024-07-11 MED ORDER — ACETAMINOPHEN 10 MG/ML IV SOLN: 1000.0000 mg | Freq: Once | INTRAVENOUS | Status: DC | PRN

## 2024-07-11 MED ORDER — INDOCYANINE GREEN 25 MG IV SOLR
1.2500 mg | Freq: Once | INTRAVENOUS | Status: AC
Start: 2024-07-11 — End: 2024-07-11
  Administered 2024-07-11: 1.25 mg via INTRAVENOUS

## 2024-07-11 MED ORDER — PHENYLEPHRINE 80 MCG/ML (10ML) SYRINGE FOR IV PUSH (FOR BLOOD PRESSURE SUPPORT)
PREFILLED_SYRINGE | INTRAVENOUS | Status: DC | PRN
  Administered 2024-07-11: 160 ug via INTRAVENOUS
  Administered 2024-07-11: 80 ug via INTRAVENOUS
  Administered 2024-07-11 (×3): 160 ug via INTRAVENOUS

## 2024-07-11 MED ORDER — CHLORHEXIDINE GLUCONATE CLOTH 2 % EX PADS: 6.0000 | MEDICATED_PAD | Freq: Once | CUTANEOUS | Status: DC

## 2024-07-11 MED ORDER — 0.9 % SODIUM CHLORIDE (POUR BTL) OPTIME
TOPICAL | Status: DC | PRN
  Administered 2024-07-11: 500 mL

## 2024-07-11 MED ORDER — ACETAMINOPHEN 10 MG/ML IV SOLN
INTRAVENOUS | Status: AC
  Filled 2024-07-11: qty 100

## 2024-07-11 MED ORDER — ACETAMINOPHEN 325 MG PO TABS
650.0000 mg | ORAL_TABLET | Freq: Three times a day (TID) | ORAL | 0 refills | Status: AC | PRN
Start: 2024-07-11 — End: 2024-08-10
  Filled 2024-07-11: qty 40, 7d supply, fill #0

## 2024-07-11 MED ORDER — MIDAZOLAM HCL 2 MG/2ML IJ SOLN
INTRAMUSCULAR | Status: AC
  Filled 2024-07-11: qty 2

## 2024-07-11 MED ORDER — CEFAZOLIN SODIUM-DEXTROSE 2-4 GM/100ML-% IV SOLN
2.0000 g | INTRAVENOUS | Status: AC
Start: 2024-07-11 — End: 2024-07-11
  Administered 2024-07-11: 2 g via INTRAVENOUS

## 2024-07-11 MED ORDER — OXYCODONE HCL 5 MG PO TABS
5.0000 mg | ORAL_TABLET | Freq: Once | ORAL | Status: AC | PRN
  Administered 2024-07-11: 5 mg via ORAL

## 2024-07-11 MED ORDER — ROCURONIUM BROMIDE 100 MG/10ML IV SOLN
INTRAVENOUS | Status: DC | PRN
  Administered 2024-07-11: 50 mg via INTRAVENOUS
  Administered 2024-07-11: 20 mg via INTRAVENOUS

## 2024-07-11 MED ORDER — BUPIVACAINE-EPINEPHRINE (PF) 0.5% -1:200000 IJ SOLN
INTRAMUSCULAR | Status: DC | PRN
  Administered 2024-07-11: 30 mL via PERINEURAL

## 2024-07-11 MED ORDER — PROPOFOL 10 MG/ML IV BOLUS
INTRAVENOUS | Status: DC | PRN
  Administered 2024-07-11: 200 mg via INTRAVENOUS

## 2024-07-11 MED ORDER — BUPIVACAINE-EPINEPHRINE (PF) 0.5% -1:200000 IJ SOLN
INTRAMUSCULAR | Status: AC
  Filled 2024-07-11: qty 30

## 2024-07-11 MED ORDER — CALCIUM CHLORIDE 10 % IV SOLN
INTRAVENOUS | Status: AC
  Filled 2024-07-11: qty 10

## 2024-07-11 MED ORDER — FENTANYL CITRATE (PF) 100 MCG/2ML IJ SOLN
INTRAMUSCULAR | Status: DC | PRN
  Administered 2024-07-11: 50 ug via INTRAVENOUS
  Administered 2024-07-11: 100 ug via INTRAVENOUS

## 2024-07-11 MED ORDER — OXYCODONE HCL 5 MG PO TABS
ORAL_TABLET | ORAL | Status: AC
  Filled 2024-07-11: qty 1

## 2024-07-11 SURGICAL SUPPLY — 39 items
ANCHOR TIS RET SYS 235ML (MISCELLANEOUS) ×1 IMPLANT
BAG PRESSURE INF REUSE 1000 (BAG) IMPLANT
CAUTERY HOOK MNPLR 1.6 DVNC XI (INSTRUMENTS) ×1 IMPLANT
CLIP LIGATING HEMO O LOK GREEN (MISCELLANEOUS) ×1 IMPLANT
DEFOGGER SCOPE WARM SEASHARP (MISCELLANEOUS) ×1 IMPLANT
DERMABOND ADVANCED .7 DNX12 (GAUZE/BANDAGES/DRESSINGS) ×1 IMPLANT
DRAPE ARM DVNC X/XI (DISPOSABLE) ×4 IMPLANT
DRAPE C-ARM XRAY 36X54 (DRAPES) IMPLANT
DRAPE COLUMN DVNC XI (DISPOSABLE) ×1 IMPLANT
ELECTRODE REM PT RTRN 9FT ADLT (ELECTROSURGICAL) ×1 IMPLANT
FORCEPS BPLR FENES DVNC XI (FORCEP) ×1 IMPLANT
FORCEPS PROGRASP DVNC XI (FORCEP) ×1 IMPLANT
GLOVE BIOGEL PI IND STRL 7.0 (GLOVE) ×2 IMPLANT
GLOVE SURG SYN 6.5 PF PI (GLOVE) ×4 IMPLANT
GOWN STRL REUS W/ TWL LRG LVL3 (GOWN DISPOSABLE) ×4 IMPLANT
GRASPER SUT TROCAR 14GX15 (MISCELLANEOUS) IMPLANT
HEMOSTAT SURGICEL 2X14 (HEMOSTASIS) IMPLANT
IRRIGATOR SUCT 8 DISP DVNC XI (IRRIGATION / IRRIGATOR) IMPLANT
IV 0.9% NACL 1000 ML (IV SOLUTION) IMPLANT
KIT TURNOVER KIT A (KITS) ×1 IMPLANT
LABEL OR SOLS (LABEL) ×1 IMPLANT
MANIFOLD NEPTUNE II (INSTRUMENTS) ×1 IMPLANT
NDL HYPO 22X1.5 SAFETY MO (MISCELLANEOUS) ×1 IMPLANT
NDL INSUFFLATION 14GA 120MM (NEEDLE) ×1 IMPLANT
NEEDLE HYPO 22X1.5 SAFETY MO (MISCELLANEOUS) ×1 IMPLANT
NEEDLE INSUFFLATION 14GA 120MM (NEEDLE) ×1 IMPLANT
NS IRRIG 500ML POUR BTL (IV SOLUTION) ×1 IMPLANT
OBTURATOR OPTICALSTD 8 DVNC (TROCAR) ×1 IMPLANT
PACK LAP CHOLECYSTECTOMY (MISCELLANEOUS) ×1 IMPLANT
SEAL UNIV 5-12 XI (MISCELLANEOUS) ×4 IMPLANT
SET TUBE SMOKE EVAC HIGH FLOW (TUBING) ×1 IMPLANT
SOLUTION ELECTROSURG ANTI STCK (MISCELLANEOUS) ×1 IMPLANT
SPIKE FLUID TRANSFER (MISCELLANEOUS) ×2 IMPLANT
SUT VICRYL 0 UR6 27IN ABS (SUTURE) ×1 IMPLANT
SUTURE MNCRL 4-0 27XMF (SUTURE) ×2 IMPLANT
SYR 30ML LL (SYRINGE) IMPLANT
SYSTEM WECK SHIELD CLOSURE (TROCAR) IMPLANT
TRAP FLUID SMOKE EVACUATOR (MISCELLANEOUS) ×1 IMPLANT
WATER STERILE IRR 500ML POUR (IV SOLUTION) ×1 IMPLANT

## 2024-07-11 NOTE — H&P (Signed)
 Subjective:   CC: Biliary dyskinesia [K82.8]  HPI: referred by Ladell Francis Boss, MD for evaluation of above CC.   History of Present Illness Tamara Harrison is a 28 year old female who presents with right upper quadrant and epigastric pain.  She experiences pain in the right upper quadrant and primarily in the epigastric region, radiating to her back. The pain worsens with the consumption of fried fatty foods. There is no indigestion, nausea, vomiting, bloating, gas, diarrhea, or jaundice. A HIDA scan shows a 3% ejection fraction.  Past Medical History: has a past medical history of Anxiety, Depression, and PTSD (post-traumatic stress disorder).  Past Surgical History: has a past surgical history that includes Tubal ligation (03/24/2022).  Family History: family history is not on file.  Social History: reports that she has never smoked. She has never used smokeless tobacco. She reports that she does not currently use alcohol. She reports that she does not use drugs.  Current Medications: has a current medication list which includes the following prescription(s): paroxetine , semaglutide, and paroxetine .  Allergies:  Allergies as of 06/10/2024  (No Known Allergies)   ROS:  A 15 point review of systems was performed and pertinent positives and negatives noted in HPI   Objective:    BP 112/65  Pulse 62  Ht 167.6 cm (5' 6)  Wt (!) 106.1 kg (234 lb)  LMP 05/13/2024  BMI 37.77 kg/m   Constitutional : No distress, cooperative, alert  Lymphatics/Throat: Supple with no lymphadenopathy  Respiratory: Clear to auscultation bilaterally  Cardiovascular: Regular rate and rhythm  Gastrointestinal: Soft, non-tender, non-distended, no organomegaly.  Musculoskeletal: Steady gait and movement  Skin: Cool and moist  Psychiatric: Normal affect, non-agitated, not confused    LABS:  N/a   RADS: CLINICAL DATA: Abdominal pain. Cholelithiasis.   EXAM:  NUCLEAR MEDICINE HEPATOBILIARY  IMAGING WITH GALLBLADDER EF   TECHNIQUE:  Sequential images of the abdomen were obtained out to 60 minutes  following intravenous administration of radiopharmaceutical. After  oral ingestion of Ensure, gallbladder ejection fraction was  determined. At 60 min, normal ejection fraction is greater than 33%.   RADIOPHARMACEUTICALS: 5.2 mCi Tc-51m Choletec  IV   COMPARISON: None Available.   FINDINGS:  Prompt uptake and biliary excretion of activity by the liver is  seen. Gallbladder activity is visualized, consistent with patency of  cystic duct. Biliary activity passes into small bowel, consistent  with patent common bile duct.   Calculated gallbladder ejection fraction is 3%. (Normal gallbladder  ejection fraction with Ensure is greater than 33% and less than  80%.)   IMPRESSION:  Normal hepatobiliary scan, demonstrating patency of both the cystic  and common bile ducts.   Decreased gallbladder ejection fraction of 3%, which can be seen  with biliary dyskinesia.   Electronically Signed  By: Norleen DELENA Kil M.D.  On: 06/06/2024 10:40   CLINICAL DATA: epigastric pain/ruq pain   EXAM:  ABDOMEN ULTRASOUND COMPLETE   COMPARISON: Feb 18, 2020.   FINDINGS:  Gallbladder: Shadowing cholelithiasis. No wall thickening  visualized. No sonographic Murphy sign noted by sonographer.   Common bile duct: Diameter: Visualized portion measures 4 mm, within  normal limits.   Liver: No focal lesion identified. Previously described possible  echogenic mass is not discretely visualized on today's exam.  Diffusely mildly increased in parenchymal echogenicity. Portal vein  is patent on color Doppler imaging with normal direction of blood  flow towards the liver.   IVC: No abnormality visualized.   Pancreas: Visualized portion  unremarkable.   Spleen: Size and appearance within normal limits.   Right Kidney: Length: 12.4 cm. Echogenicity within normal limits. No  mass or hydronephrosis  visualized.   Left Kidney: Length: 11.5 cm. Echogenicity within normal limits. No  mass or hydronephrosis visualized.   Abdominal aorta: No aneurysm visualized. Portions are not visualized  secondary to shadowing bowel gas.   Other findings: None.   IMPRESSION:  1. Cholelithiasis without sonographic evidence of acute  cholecystitis.  2. Mildly increased hepatic parenchymal echogenicity as can be seen  in hepatic steatosis.  Assessment:    Biliary dyskinesia [K82.8]- symptoms consistent as well so will proceed  Plan:    1. Biliary dyskinesia [K82.8] Discussed the risk of surgery including post-op infxn, seroma, biloma, chronic pain, poor-delayed wound healing, retained gallstone, conversion to open procedure, post-op SBO or ileus, and need for additional procedures to address said risks. The risks of general anesthetic including MI, CVA, sudden death or even reaction to anesthetic medications also discussed. Alternatives include continued observation. Benefits include possible symptom relief, prevention of complications including acute cholecystitis, pancreatitis.  Typical post operative recovery of 3-5 days rest, continued pain in area and incision sites, possible loose stools up to 4-6 weeks, also discussed.  ED return precautions given for sudden increase in RUQ pain, with possible accompanying fever, nausea, and/or vomiting.  The patient understands the risks, any and all questions were answered to the patient's satisfaction.  2. Biliary dyskinesia [K82.8]. Will proceed with robotic assisted laparoscopic cholecystectomy [phone]  labs/images/medications/previous chart entries reviewed personally and relevant changes/updates noted above.

## 2024-07-11 NOTE — Op Note (Signed)
 Preoperative diagnosis:  biliary dyskinesia  Postoperative diagnosis: same as above  Procedure: Robotic assisted Laparoscopic Cholecystectomy.   Anesthesia: GETA   Surgeon: Henriette Pierre  Specimen: Gallbladder  Complications: None  EBL: 15mL  Wound Classification: Clean Contaminated  Indications: see HPI  Findings: Critical view of safety noted Cystic duct and artery identified, ligated and divided, clips remained intact at end of procedure Adequate hemostasis  Description of procedure:  The patient was placed on the operating table in the supine position. SCDs placed, pre-op abx administered.  General anesthesia was induced and OG tube placed by anesthesia. A time-out was completed verifying correct patient, procedure, site, positioning, and implant(s) and/or special equipment prior to beginning this procedure. The abdomen was prepped and draped in the usual sterile fashion.    Veress needle was placed at the Palmer's point and insufflation was started after confirming a positive saline drop test and no immediate increase in abdominal pressure.  After reaching 15 mm, the Veress needle was removed and a 8 mm port was placed via optiview technique under umbilicus measured 20mm from gallbladder.  The abdomen was inspected and no abnormalities or injuries were found.  Under direct vision, ports were placed in the following locations: One 12 mm patient left of the umbilicus, 8cm from the periumbilical port, one 8 mm port placed to the patient right of the umbilical port 8 cm apart.  1 additional 8 mm port placed lateral to the 12mm port.  Once ports were placed, The table was placed in the reverse Trendelenburg position with the right side up. The Xi platform was brought into the operative field and docked to the ports successfully.  An endoscope was placed through the umbilical port, fenestrated grasper through the adjacent patient right port, prograsp to the far patient left port, and then a  hook cautery in the left port.  The dome of the gallbladder was grasped with prograsp, passed and retracted over the dome of the liver. Adhesions between the gallbladder and omentum, duodenum and transverse colon were lysed via hook cautery. The infundibulum was grasped with the fenestrated grasper and retracted toward the right lower quadrant. This maneuver exposed Calot's triangle. The peritoneum overlying the gallbladder infundibulum was then dissected  and the cystic duct and cystic artery identified.  Critical view of safety with the liver bed clearly visible behind the duct and artery with no additional structures noted.  The cystic duct and cystic artery clipped and divided close to the gallbladder.     The gallbladder was then dissected from its peritoneal and liver bed attachments by electrocautery. Hemostasis was checked prior to removing the hook cautery and the Endo Catch bag was then placed through the 12 mm port and the gallbladder was removed.  The gallbladder was passed off the table as a specimen.  Some bleeding from the gallbladder fossa controlled with electrocautery.  Surgifoam placed in the fossa after gallbladder removal to ensure continued hemostasis.  No leakage from cystic artery or leakage of the bile from the cystic duct stump. The 12 mm port site closed with PMI using 0 vicryl under direct vision.  Abdomen desufflated and secondary trocars were removed under direct vision. No bleeding was noted. All skin incisions then closed with subcuticular sutures of 4-0 monocryl and dressed with topical skin adhesive. The orogastric tube was removed and patient extubated.  The patient tolerated the procedure well and was taken to the postanesthesia care unit in stable condition.  All sponge and instrument count correct  at end of procedure.

## 2024-07-11 NOTE — Discharge Instructions (Addendum)
 Laparoscopic Cholecystectomy, Care After This sheet gives you information about how to care for yourself after your procedure. Your doctor may also give you more specific instructions. If you have problems or questions, contact your doctor. Follow these instructions at home: Care for cuts from surgery (incisions)  Follow instructions from your doctor about how to take care of your cuts from surgery. Make sure you: Wash your hands with soap and water before you change your bandage (dressing). If you cannot use soap and water, use hand sanitizer. Change your bandage as told by your doctor. Leave stitches (sutures), skin glue, or skin tape (adhesive) strips in place. They may need to stay in place for 2 weeks or longer. If tape strips get loose and curl up, you may trim the loose edges. Do not remove tape strips completely unless your doctor says it is okay. Do not take baths, swim, or use a hot tub until your doctor says it is okay. OK TO SHOWER 24HRS AFTER YOUR SURGERY.  Check your surgical cut area every day for signs of infection. Check for: More redness, swelling, or pain. More fluid or blood. Warmth. Pus or a bad smell. Activity Do not drive or use heavy machinery while taking prescription pain medicine. Do not play contact sports until your doctor says it is okay. Do not drive for 24 hours if you were given a medicine to help you relax (sedative). Rest as needed. Do not return to work or school until your doctor says it is okay. General instructions  tylenol and advil as needed for discomfort.  Please alternate between the two every four hours as needed for pain.    Use narcotics, if prescribed, only when tylenol and motrin is not enough to control pain.  325-650mg  every 8hrs to max of 3000mg /24hrs (including the 325mg  in every norco dose) for the tylenol.    Advil up to 800mg  per dose every 8hrs as needed for pain.   To prevent or treat constipation while you are taking prescription  pain medicine, your doctor may recommend that you: Drink enough fluid to keep your pee (urine) clear or pale yellow. Take over-the-counter or prescription medicines. Eat foods that are high in fiber, such as fresh fruits and vegetables, whole grains, and beans. Limit foods that are high in fat and processed sugars, such as fried and sweet foods. Contact a doctor if: You develop a rash. You have more redness, swelling, or pain around your surgical cuts. You have more fluid or blood coming from your surgical cuts. Your surgical cuts feel warm to the touch. You have pus or a bad smell coming from your surgical cuts. You have a fever. One or more of your surgical cuts breaks open. You have trouble breathing. You have chest pain. You have pain that is getting worse in your shoulders. You faint or feel dizzy when you stand. You have very bad pain in your belly (abdomen). You are sick to your stomach (nauseous) for more than one day. You have throwing up (vomiting) that lasts for more than one day. You have leg pain. This information is not intended to replace advice given to you by your health care provider. Make sure you discuss any questions you have with your health care provider. Document Released: 06/27/2008 Document Revised: 04/08/2016 Document Reviewed: 03/06/2016 Elsevier Interactive Patient Education  2019 [employer].

## 2024-07-11 NOTE — Anesthesia Postprocedure Evaluation (Signed)
 Anesthesia Post Note  Patient: AIRABELLA BARLEY  Procedure(s) Performed: CHOLECYSTECTOMY, ROBOT-ASSISTED, LAPAROSCOPIC (Abdomen)  Patient location during evaluation: PACU Anesthesia Type: General Level of consciousness: awake and alert, oriented and patient cooperative Pain management: pain level controlled Vital Signs Assessment: post-procedure vital signs reviewed and stable Respiratory status: spontaneous breathing, nonlabored ventilation and respiratory function stable Cardiovascular status: blood pressure returned to baseline and stable Postop Assessment: adequate PO intake Anesthetic complications: yes   Encounter Notable Events  Notable Event Outcome Phase Comment  Vomiting Resolved in Lab Postprocedure, before discharge      Last Vitals:  Vitals:   07/11/24 0900 07/11/24 0915  BP: 124/70 117/75  Pulse: 65 61  Resp: (!) 24 (!) 23  Temp:    SpO2: 100% 100%    Last Pain:  Vitals:   07/11/24 0928  TempSrc:   PainSc: 5                  Alfonso Ruths

## 2024-07-11 NOTE — Anesthesia Preprocedure Evaluation (Addendum)
 Anesthesia Evaluation  Patient identified by MRN, date of birth, ID band Patient awake    Reviewed: Allergy & Precautions, NPO status , Patient's Chart, lab work & pertinent test results  History of Anesthesia Complications (+) history of anesthetic complications (03/24/22 Post-obstruction pulmonary edema requiring admission with supplemental oxygen)  Airway Mallampati: I   Neck ROM: Full    Dental no notable dental hx.    Pulmonary sleep apnea and Continuous Positive Airway Pressure Ventilation    Pulmonary exam normal breath sounds clear to auscultation       Cardiovascular Normal cardiovascular exam Rhythm:Regular Rate:Normal     Neuro/Psych  PSYCHIATRIC DISORDERS Anxiety Depression    negative neurological ROS     GI/Hepatic ,GERD  ,,  Endo/Other  Obesity   Renal/GU negative Renal ROS     Musculoskeletal   Abdominal   Peds  Hematology  (+) Blood dyscrasia, anemia   Anesthesia Other Findings Last dose of [facility] 06/30/24.  Reproductive/Obstetrics                              Anesthesia Physical Anesthesia Plan  ASA: 2  Anesthesia Plan: General   Post-op Pain Management:    Induction: Intravenous  PONV Risk Score and Plan: 3 and Ondansetron , Dexamethasone  and Treatment may vary due to age or medical condition  Airway Management Planned: Oral ETT  Additional Equipment:   Intra-op Plan:   Post-operative Plan: Extubation in OR  Informed Consent: I have reviewed the patients History and Physical, chart, labs and discussed the procedure including the risks, benefits and alternatives for the proposed anesthesia with the patient or authorized representative who has indicated his/her understanding and acceptance.     Dental advisory given  Plan Discussed with: CRNA  Anesthesia Plan Comments: (Patient consented for risks of anesthesia including but not limited to:  - adverse  reactions to medications - damage to eyes, teeth, lips or other oral mucosa - nerve damage due to positioning  - sore throat or hoarseness - damage to heart, brain, nerves, lungs, other parts of body or loss of life  Informed patient about role of CRNA in peri- and intra-operative care.  Patient voiced understanding.)         Anesthesia Quick Evaluation

## 2024-07-11 NOTE — Interval H&P Note (Signed)
 History and Physical Interval Note:  07/11/2024 7:24 AM  Tamara Harrison  has presented today for surgery, with the diagnosis of K82.8 Biliary dyskinesia.  The various methods of treatment have been discussed with the patient and family. After consideration of risks, benefits and other options for treatment, the patient has consented to  Procedure(s): CHOLECYSTECTOMY, ROBOT-ASSISTED, LAPAROSCOPIC (N/A) as a surgical intervention.  The patient's history has been reviewed, patient examined, no change in status, stable for surgery.  I have reviewed the patient's chart and labs.  Questions were answered to the patient's satisfaction.     Mariam Helbert Tye

## 2024-07-11 NOTE — Transfer of Care (Signed)
 Immediate Anesthesia Transfer of Care Note  Patient: Tamara Harrison  Procedure(s) Performed: CHOLECYSTECTOMY, ROBOT-ASSISTED, LAPAROSCOPIC (Abdomen)  Patient Location: PACU  Anesthesia Type:General  Level of Consciousness: awake, alert , oriented, and patient cooperative  Airway & Oxygen Therapy: Patient connected to face mask oxygen  Post-op Assessment: Report given to RN and Post -op Vital signs reviewed and stable  Post vital signs: stable  Last Vitals:  Vitals Value Taken Time  BP 124/70 07/11/24 09:00  Temp 36.4 C 07/11/24 08:56  Pulse 65 07/11/24 09:02  Resp 23 07/11/24 09:02  SpO2 100 % 07/11/24 09:02  Vitals shown include unfiled device data.  Last Pain:  Vitals:   07/11/24 9367  TempSrc: Temporal  PainSc: 5       Patients Stated Pain Goal: 0 (07/11/24 9367)  Complications: No notable events documented.

## 2024-07-14 LAB — SURGICAL PATHOLOGY
